# Patient Record
Sex: Female | Born: 1970 | Race: Black or African American | Hispanic: No | Marital: Single | Smoking: Never smoker
Health system: Southern US, Community
[De-identification: ages and names within clinical notes are randomized; demographics above are authoritative.]

## PROBLEM LIST (undated history)

## (undated) DIAGNOSIS — Q231 Congenital insufficiency of aortic valve: Secondary | ICD-10-CM

## (undated) DIAGNOSIS — F32A Depression, unspecified: Secondary | ICD-10-CM

## (undated) DIAGNOSIS — F329 Major depressive disorder, single episode, unspecified: Secondary | ICD-10-CM

## (undated) DIAGNOSIS — K56609 Unspecified intestinal obstruction, unspecified as to partial versus complete obstruction: Secondary | ICD-10-CM

## (undated) HISTORY — PX: UMBILICAL HERNIA REPAIR: SHX196

## (undated) HISTORY — PX: GASTROPLASTY DUODENAL SWITCH: SHX1699

## (undated) HISTORY — PX: LAPAROSCOPIC GASTRIC BANDING: SHX1100

---

## 2005-05-20 HISTORY — PX: LAPAROSCOPIC GASTRIC BANDING: SHX1100

## 2009-05-20 HISTORY — PX: GASTROPLASTY DUODENAL SWITCH: SHX1699

## 2011-05-21 HISTORY — PX: UMBILICAL HERNIA REPAIR: SHX196

## 2011-05-21 HISTORY — PX: BOWEL RESECTION: SHX1257

## 2016-06-09 ENCOUNTER — Encounter (HOSPITAL_COMMUNITY): Payer: Self-pay | Admitting: Emergency Medicine

## 2016-06-09 ENCOUNTER — Other Ambulatory Visit (HOSPITAL_COMMUNITY): Payer: Self-pay | Admitting: Radiology

## 2016-06-09 ENCOUNTER — Emergency Department (HOSPITAL_COMMUNITY): Payer: 59

## 2016-06-09 ENCOUNTER — Emergency Department (HOSPITAL_COMMUNITY)
Admission: EM | Admit: 2016-06-09 | Discharge: 2016-06-09 | Disposition: A | Discharge status: Home / Self Care | Payer: 59 | Attending: Emergency Medicine | Admitting: Emergency Medicine

## 2016-06-09 DIAGNOSIS — R930 Abnormal findings on diagnostic imaging of skull and head, not elsewhere classified: Secondary | ICD-10-CM | POA: Diagnosis not present

## 2016-06-09 DIAGNOSIS — R51 Headache: Secondary | ICD-10-CM | POA: Diagnosis not present

## 2016-06-09 DIAGNOSIS — R42 Dizziness and giddiness: Secondary | ICD-10-CM | POA: Diagnosis present

## 2016-06-09 DIAGNOSIS — R519 Headache, unspecified: Secondary | ICD-10-CM

## 2016-06-09 HISTORY — DX: Major depressive disorder, single episode, unspecified: F32.9

## 2016-06-09 HISTORY — DX: Depression, unspecified: F32.A

## 2016-06-09 HISTORY — DX: Congenital insufficiency of aortic valve: Q23.1

## 2016-06-09 HISTORY — DX: Unspecified intestinal obstruction, unspecified as to partial versus complete obstruction: K56.609

## 2016-06-09 LAB — BASIC METABOLIC PANEL
ANION GAP: 9 (ref 5–15)
BUN: <5 mg/dL — ABNORMAL LOW (ref 6–20)
CALCIUM: 8.9 mg/dL (ref 8.9–10.3)
CHLORIDE: 104 mmol/L (ref 101–111)
CO2: 24 mmol/L (ref 22–32)
Creatinine, Ser: 0.65 mg/dL (ref 0.44–1.00)
GFR calc non Af Amer: >60 mL/min (ref >60)
Glucose, Bld: 105 mg/dL — ABNORMAL HIGH (ref 65–99)
Potassium: 3.6 mmol/L (ref 3.5–5.1)
Sodium: 137 mmol/L (ref 135–145)

## 2016-06-09 LAB — MAGNESIUM: Magnesium: 1.8 mg/dL (ref 1.7–2.4)

## 2016-06-09 LAB — CBC
HCT: 36.9 % (ref 36.0–46.0)
HEMOGLOBIN: 11.7 g/dL — AB (ref 12.0–15.0)
MCH: 27.5 pg (ref 26.0–34.0)
MCHC: 31.7 g/dL (ref 30.0–36.0)
MCV: 86.8 fL (ref 78.0–100.0)
Platelets: 326 10*3/uL (ref 150–400)
RBC: 4.25 MIL/uL (ref 3.87–5.11)
RDW: 15.3 % (ref 11.5–15.5)
WBC: 7.1 10*3/uL (ref 4.0–10.5)

## 2016-06-09 LAB — I-STAT BETA HCG BLOOD, ED (MC, WL, AP ONLY): I-stat hCG, quantitative: <5 m[IU]/mL (ref <5)

## 2016-06-09 LAB — CBG MONITORING, ED: GLUCOSE-CAPILLARY: 96 mg/dL (ref 65–99)

## 2016-06-09 MED ORDER — CLONAZEPAM 0.5 MG PO TABS: 0.5000 mg | ORAL_TABLET | Freq: Two times a day (BID) | ORAL | 0 refills | Status: DC | PRN

## 2016-06-09 MED ORDER — NAPROXEN 500 MG PO TABS: 500.0000 mg | ORAL_TABLET | Freq: Two times a day (BID) | ORAL | 0 refills | Status: DC

## 2016-06-09 MED ORDER — MECLIZINE HCL 25 MG PO TABS: 25.0000 mg | ORAL_TABLET | Freq: Three times a day (TID) | ORAL | 0 refills | Status: DC | PRN

## 2016-06-09 MED ORDER — ACETAMINOPHEN 500 MG PO TABS
1000.0000 mg | ORAL_TABLET | Freq: Once | ORAL | Status: AC
  Administered 2016-06-09: 1000 mg via ORAL
  Filled 2016-06-09: qty 2

## 2016-06-09 NOTE — ED Notes (Signed)
Patient transported to MRI 

## 2016-06-09 NOTE — ED Notes (Signed)
ED Provider at bedside. 

## 2016-06-09 NOTE — ED Triage Notes (Signed)
States feels dizzy and  Weird states  Has had no n/v/d ate last night,  felt she popped a   Vessel in her head  A week ago states was straining, but not to poop, has been out ofone of her meds that she thinks she may be going thru  [place] all going on last few days , has just moved here since June no new dr

## 2016-06-09 NOTE — ED Provider Notes (Signed)
[facility]-EMERGENCY DEPT Provider Note   CSN: [id number] Arrival date & time: 06/09/16  1048     History   Chief Complaint Chief Complaint  Patient presents with  . Dizziness    HPI Shelia Perez is a 45 y.o. female.  HPI The patient was physically straining last week. She reports that she got a sudden sensation in her head that felt like a "blood vessel popped". She denies history of migraines or chronic headaches. She reports now she has had ongoing problem with a headache that comes up the back of her neck and goes to the top of her head. She has felt dizzy when she changes position or moves. It is somewhat improved by keeping her eyes closed and her head still. Dizziness has a rocking quality to it. No visual changes. Positive photophobia. Nausea but no vomiting. No gait incoordination. She feels like she is just not herself and is having trouble concentrating. No fever. Patient reports once in the past she had a lot of problems with vitamins and electrolytes. No prior history of subarachnoid or migraines. Past Medical History:  Diagnosis Date  . Bicuspid aortic valve   . Bowel obstruction   . Depression     There are no active problems to display for this patient.   Past Surgical History:  Procedure Laterality Date  . CESAREAN SECTION    . GASTROPLASTY DUODENAL SWITCH    . LAPAROSCOPIC GASTRIC BANDING    . UMBILICAL HERNIA REPAIR      OB History    No data available       Home Medications    Prior to Admission medications   Medication Sig Start Date End Date Taking? Authorizing Provider  meclizine (ANTIVERT) 25 MG tablet Take 1 tablet (25 mg total) by mouth 3 (three) times daily as needed for dizziness. 06/09/16   Arby Barrette, MD  naproxen (NAPROSYN) 500 MG tablet Take 1 tablet (500 mg total) by mouth 2 (two) times daily. 06/09/16   Arby Barrette, MD    Family History No family history on file.  Social History Social History  Substance Use Topics  .  Smoking status: Never Smoker  . Smokeless tobacco: Never Used  . Alcohol use Yes     Allergies   Codeine   Review of Systems Review of Systems 10 Systems reviewed and are negative for acute change except as noted in the HPI.   Physical Exam Updated Vital Signs BP 124/77   Pulse 101   Temp 100.1 F (37.8 C) (Oral)   Resp 16   SpO2 100%   Physical Exam  Constitutional: She is oriented to person, place, and time. She appears well-developed and well-nourished. No distress.  HENT:  Head: Normocephalic and atraumatic.  Bilateral TMs normal. Posterior or first widely patent no erythema no exudate.  Eyes: Conjunctivae and EOM are normal. Pupils are equal, round, and reactive to light.  No nystagmus.  Neck: Neck supple.  Cardiovascular: Normal rate and regular rhythm.   No murmur heard. Pulmonary/Chest: Effort normal and breath sounds normal. No respiratory distress.  Abdominal: Soft. There is no tenderness.  Musculoskeletal: Normal range of motion. She exhibits no edema or deformity.  Neurological: She is alert and oriented to person, place, and time. No cranial nerve deficit or sensory deficit. She exhibits normal muscle tone. Coordination normal.  Bilateral finger-nose examination is normal. speach is clear with normal cognitive function.  Skin: Skin is warm and dry.  Psychiatric: She has a normal mood  and affect.  Nursing note and vitals reviewed.    ED Treatments / Results  Labs (all labs ordered are listed, but only abnormal results are displayed) Labs Reviewed  BASIC METABOLIC PANEL - Abnormal; Notable for the following:       Result Value   Glucose, Bld 105 (*)    BUN <5 (*)    All other components within normal limits  CBC - Abnormal; Notable for the following:    Hemoglobin 11.7 (*)    All other components within normal limits  MAGNESIUM  URINALYSIS, ROUTINE W REFLEX MICROSCOPIC  CBG MONITORING, ED  I-STAT BETA HCG BLOOD, ED ([facility], [facility], [facility] ONLY)    EKG   EKG Interpretation None       Radiology Mr Angiogram Head Wo Contrast  Result Date: 06/09/2016 CLINICAL DATA:  Headache and vertigo. EXAM: MRI HEAD WITHOUT CONTRAST MRA HEAD WITHOUT CONTRAST TECHNIQUE: Multiplanar, multiecho pulse sequences of the brain and surrounding structures were obtained without intravenous contrast. Angiographic images of the head were obtained using MRA technique without contrast. COMPARISON:  None. FINDINGS: MRI HEAD FINDINGS Brain: There is no evidence of acute infarct, intracranial hemorrhage, mass, midline shift, or extra-axial fluid collection. The ventricles and sulci are normal. The brain is normal in signal. Limited assessment of the seventh and eighth cranial nerve complexes on this nondedicated study is unremarkable. Vascular: Major intracranial vascular flow voids are preserved. Skull and upper cervical spine: Unremarkable bone marrow signal. Sinuses/Orbits: Unremarkable orbits. Bilateral maxillary sinus mucous retention cysts. Clear mastoid air cells. Other: None. MRA HEAD FINDINGS The visualized distal vertebral arteries are widely patent and codominant. Cerebellar arteries are grossly patent. Basilar artery is widely patent. There are small posterior communicating arteries bilaterally. PCAs are patent without evidence of significant stenosis. The the visualized distal cervical internal carotid arteries are tortuous, more so on the right. The intracranial internal carotid arteries are widely patent. ACAs and MCAs are patent without evidence of major branch occlusion or significant proximal stenosis. No intracranial aneurysm is identified. IMPRESSION: 1. Unremarkable appearance of the brain. 2. Negative head MRA. Electronically Signed   By: Sebastian Ache M.D.   On: 06/09/2016 14:01   Mr Brain Wo Contrast  Result Date: 06/09/2016 CLINICAL DATA:  Headache and vertigo. EXAM: MRI HEAD WITHOUT CONTRAST MRA HEAD WITHOUT CONTRAST TECHNIQUE: Multiplanar, multiecho pulse  sequences of the brain and surrounding structures were obtained without intravenous contrast. Angiographic images of the head were obtained using MRA technique without contrast. COMPARISON:  None. FINDINGS: MRI HEAD FINDINGS Brain: There is no evidence of acute infarct, intracranial hemorrhage, mass, midline shift, or extra-axial fluid collection. The ventricles and sulci are normal. The brain is normal in signal. Limited assessment of the seventh and eighth cranial nerve complexes on this nondedicated study is unremarkable. Vascular: Major intracranial vascular flow voids are preserved. Skull and upper cervical spine: Unremarkable bone marrow signal. Sinuses/Orbits: Unremarkable orbits. Bilateral maxillary sinus mucous retention cysts. Clear mastoid air cells. Other: None. MRA HEAD FINDINGS The visualized distal vertebral arteries are widely patent and codominant. Cerebellar arteries are grossly patent. Basilar artery is widely patent. There are small posterior communicating arteries bilaterally. PCAs are patent without evidence of significant stenosis. The the visualized distal cervical internal carotid arteries are tortuous, more so on the right. The intracranial internal carotid arteries are widely patent. ACAs and MCAs are patent without evidence of major branch occlusion or significant proximal stenosis. No intracranial aneurysm is identified. IMPRESSION: 1. Unremarkable appearance of the brain. 2. Negative  head MRA. Electronically Signed   By: Sebastian AcheAllen  Grady M.D.   On: 06/09/2016 14:01    Procedures Procedures (including critical care time)  Medications Ordered in ED Medications  acetaminophen (TYLENOL) tablet 1,000 mg (1,000 mg Oral Given 06/09/16 1507)     Initial Impression / Assessment and Plan / ED Course  I have reviewed the triage vital signs and the nursing notes.  Pertinent labs & imaging results that were available during my care of the patient were reviewed by me and considered in my  medical decision making (see chart for details).     Final Clinical Impressions(s) / ED Diagnoses   Final diagnoses:  Vertigo  Acute nonintractable headache, unspecified headache type  Patient developed headache which she thought felt like a burst blood vessel. MRI/MRA does not show any evidence of aneurysm or other intracranial anomaly. She has had sporadic vertigo. No CVA identified. Patient be treated symptomatically with Antivert and naproxen. On examination she is well in appearance. She shows no signs of neurologic dysfunction or acute illness. Mental status is clear. Patient is counseled on signs and symptoms for which return and advised for follow-up evaluation.  New Prescriptions New Prescriptions   MECLIZINE (ANTIVERT) 25 MG TABLET    Take 1 tablet (25 mg total) by mouth 3 (three) times daily as needed for dizziness.   NAPROXEN (NAPROSYN) 500 MG TABLET    Take 1 tablet (500 mg total) by mouth 2 (two) times daily.     Arby BarretteMarcy Ehab Humber, MD 06/09/16 [phone]

## 2016-06-09 NOTE — ED Notes (Signed)
Pt ambulated to room with no issues, pt appeared steady on her feet

## 2018-08-03 ENCOUNTER — Other Ambulatory Visit: Payer: Self-pay | Admitting: Nurse Practitioner

## 2018-08-03 DIAGNOSIS — Z1231 Encounter for screening mammogram for malignant neoplasm of breast: Secondary | ICD-10-CM

## 2018-09-01 ENCOUNTER — Ambulatory Visit: Payer: 59

## 2018-10-16 ENCOUNTER — Ambulatory Visit: Payer: 59

## 2018-12-08 ENCOUNTER — Other Ambulatory Visit: Payer: Self-pay | Admitting: Obstetrics and Gynecology

## 2018-12-08 ENCOUNTER — Other Ambulatory Visit (HOSPITAL_COMMUNITY)
Admission: RE | Admit: 2018-12-08 | Discharge: 2018-12-08 | Disposition: A | Discharge status: Home / Self Care | Payer: Managed Care, Other (non HMO) | Source: Ambulatory Visit | Attending: Obstetrics and Gynecology | Admitting: Obstetrics and Gynecology

## 2018-12-08 DIAGNOSIS — Z01419 Encounter for gynecological examination (general) (routine) without abnormal findings: Secondary | ICD-10-CM | POA: Insufficient documentation

## 2018-12-08 DIAGNOSIS — Z1231 Encounter for screening mammogram for malignant neoplasm of breast: Secondary | ICD-10-CM

## 2018-12-31 LAB — CYTOLOGY - PAP

## 2019-01-22 ENCOUNTER — Ambulatory Visit
Admission: RE | Admit: 2019-01-22 | Discharge: 2019-01-22 | Disposition: A | Discharge status: Home / Self Care | Payer: Managed Care, Other (non HMO) | Source: Ambulatory Visit | Attending: Obstetrics and Gynecology | Admitting: Obstetrics and Gynecology

## 2019-01-22 ENCOUNTER — Other Ambulatory Visit: Payer: Self-pay

## 2019-01-22 DIAGNOSIS — Z1231 Encounter for screening mammogram for malignant neoplasm of breast: Secondary | ICD-10-CM

## 2019-07-31 ENCOUNTER — Ambulatory Visit: Payer: Managed Care, Other (non HMO) | Attending: Internal Medicine

## 2019-07-31 DIAGNOSIS — Z23 Encounter for immunization: Secondary | ICD-10-CM

## 2019-07-31 NOTE — Progress Notes (Signed)
   Covid-19 Vaccination Clinic  Name:  Shelia Perez    MRN: [id number] DOB: 08/14/1970  07/31/2019  Shelia Perez was observed post Covid-19 immunization for 15 minutes without incident. She was provided with Vaccine Information Sheet and instruction to access the V-Safe system.   Shelia Perez was instructed to call 911 with any severe reactions post vaccine: Marland Kitchen Difficulty breathing  . Swelling of face and throat  . A fast heartbeat  . A bad rash all over body  . Dizziness and weakness   Immunizations Administered    Name Date Dose VIS Date Route   Moderna COVID-19 Vaccine 07/31/2019 10:15 AM 0.5 mL 04/20/2019 Intramuscular   Manufacturer: Moderna   Lot: [id number]   NDC: 72094-709-62

## 2019-09-01 ENCOUNTER — Ambulatory Visit: Payer: Managed Care, Other (non HMO) | Attending: Internal Medicine

## 2019-09-01 DIAGNOSIS — Z23 Encounter for immunization: Secondary | ICD-10-CM

## 2019-09-01 NOTE — Progress Notes (Signed)
   Covid-19 Vaccination Clinic  Name:  Shelia Perez    MRN: [id number] DOB: 14-Apr-1971  09/01/2019  [name] was observed post Covid-19 immunization for 15 minutes without incident. She was provided with Vaccine Information Sheet and instruction to access the V-Safe system.   [name] was instructed to call 911 with any severe reactions post vaccine: Marland Kitchen Difficulty breathing  . Swelling of face and throat  . A fast heartbeat  . A bad rash all over body  . Dizziness and weakness   Immunizations Administered    Name Date Dose VIS Date Route   Moderna COVID-19 Vaccine 09/01/2019  9:44 AM 0.5 mL 04/20/2019 Intramuscular   Manufacturer: Moderna   Lot: [id number]   NDC: 14445-848-35

## 2019-10-15 IMAGING — MG MM DIGITAL SCREENING BILAT W/ TOMO W/ CAD
6 of 12 series · 6 of 36 positions shown · non-contrast
Comparison: None.

ACR Breast Density Category a: The breast tissue is almost entirely
fatty.

CLINICAL DATA: Screening.

EXAM:
DIGITAL SCREENING BILATERAL MAMMOGRAM WITH TOMO AND CAD

[L CC synth-2D (1 of 2)]
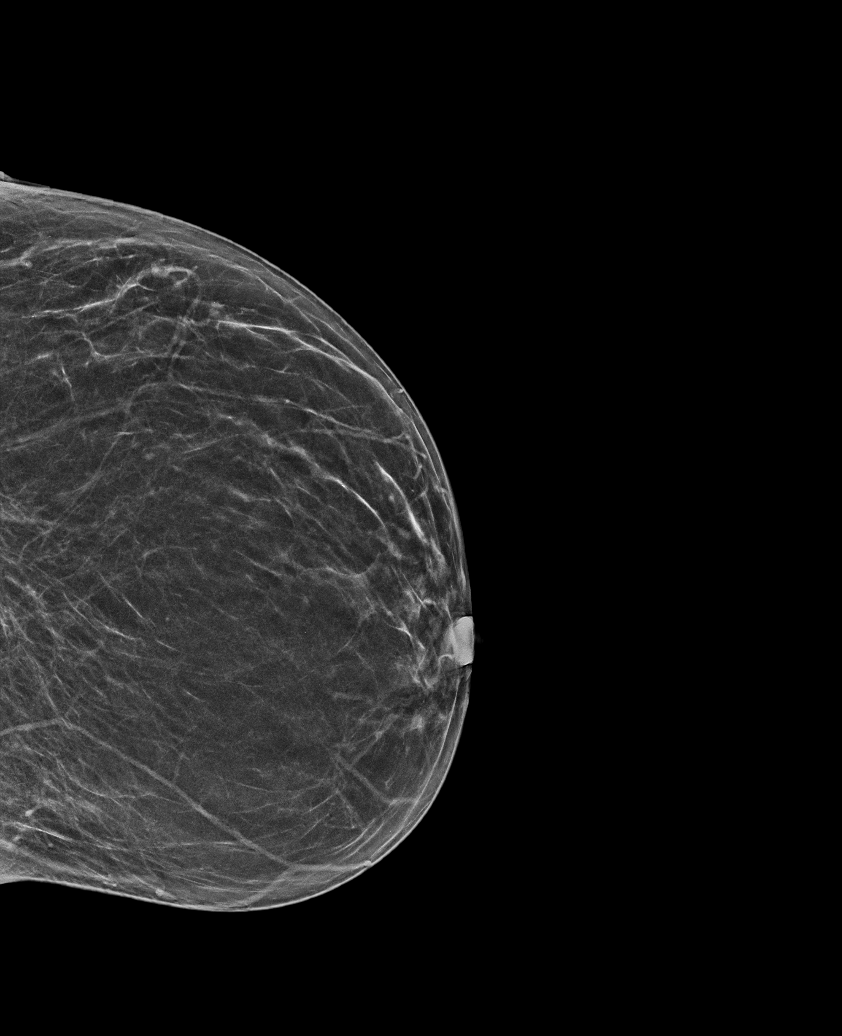

[R MLO synth-2D]
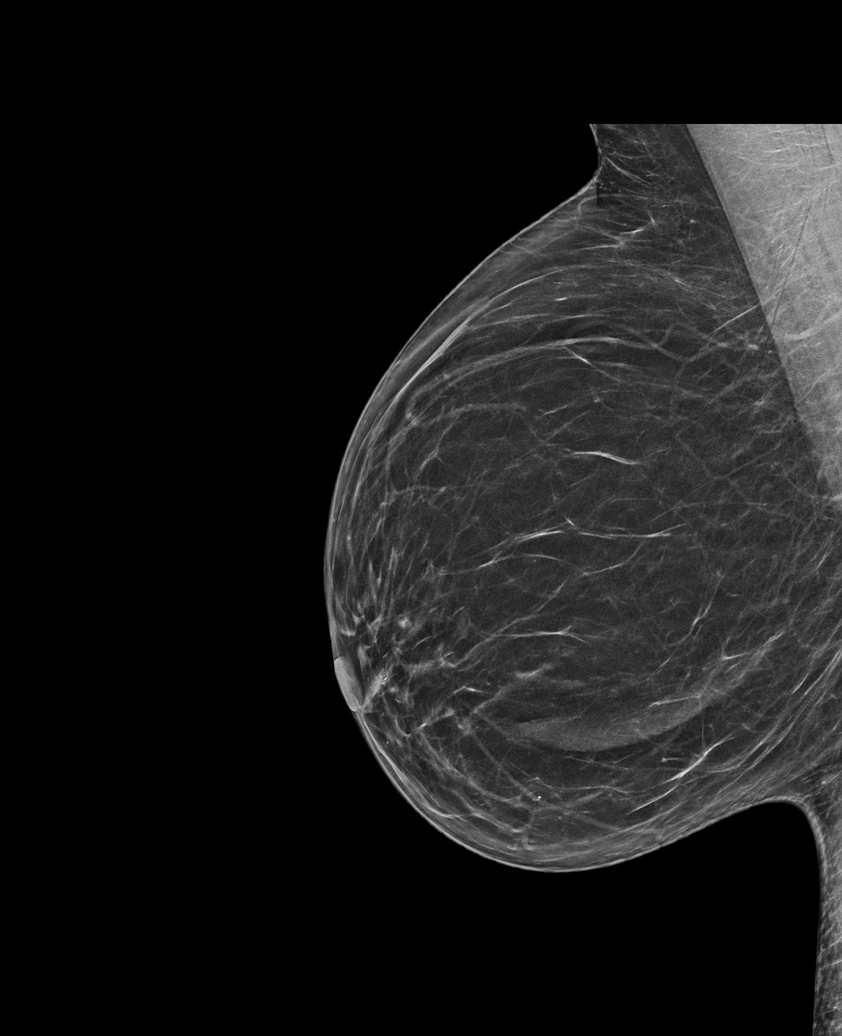

[R CC synth-2D (1 of 2)]
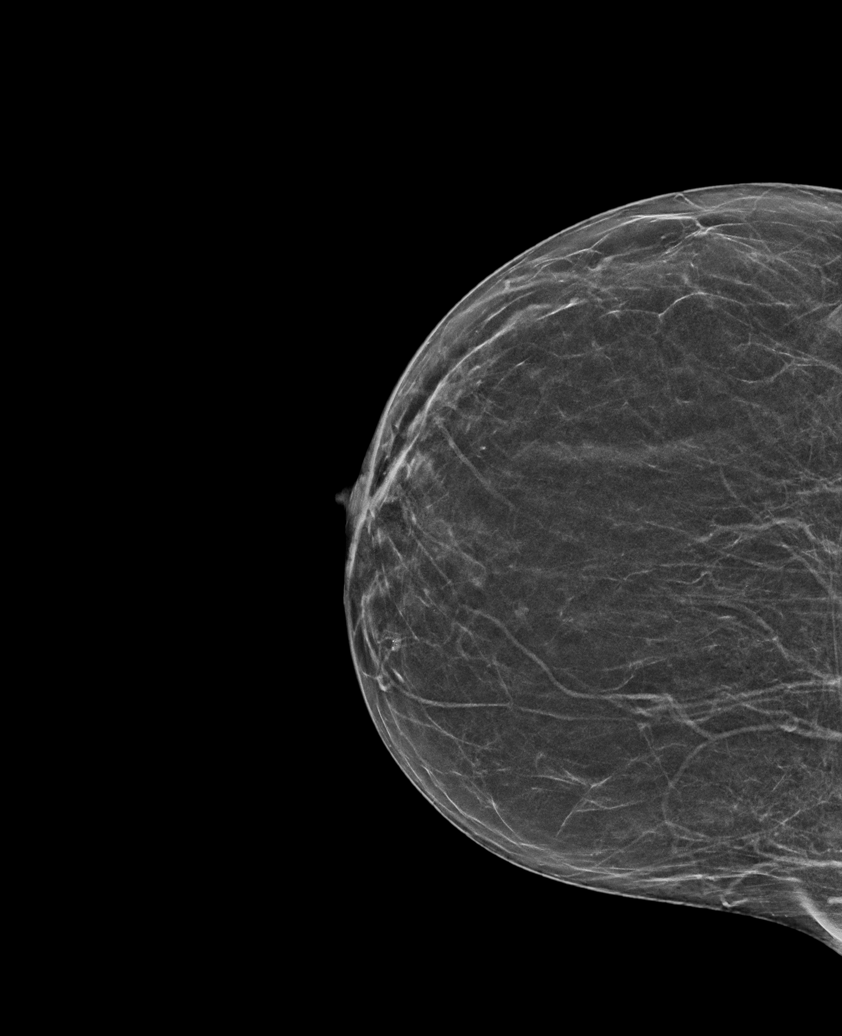

[L CC synth-2D (2 of 2)]
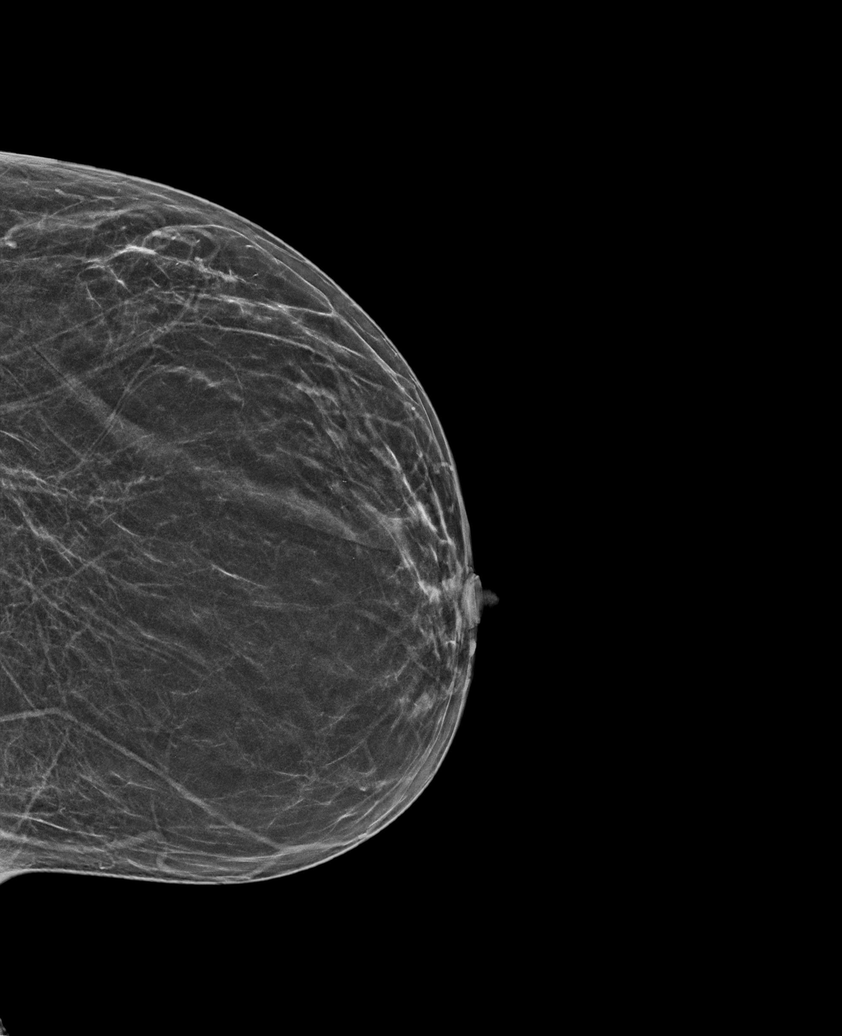

[R CC synth-2D (2 of 2)]
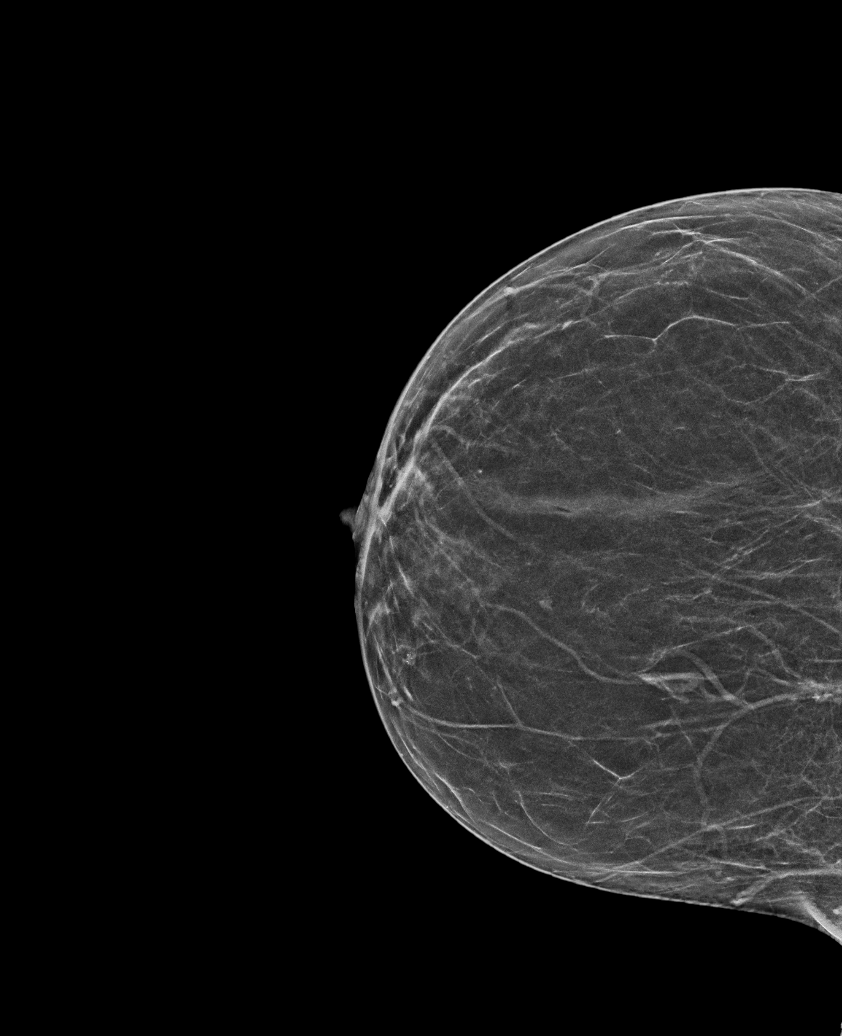

[L MLO synth-2D]
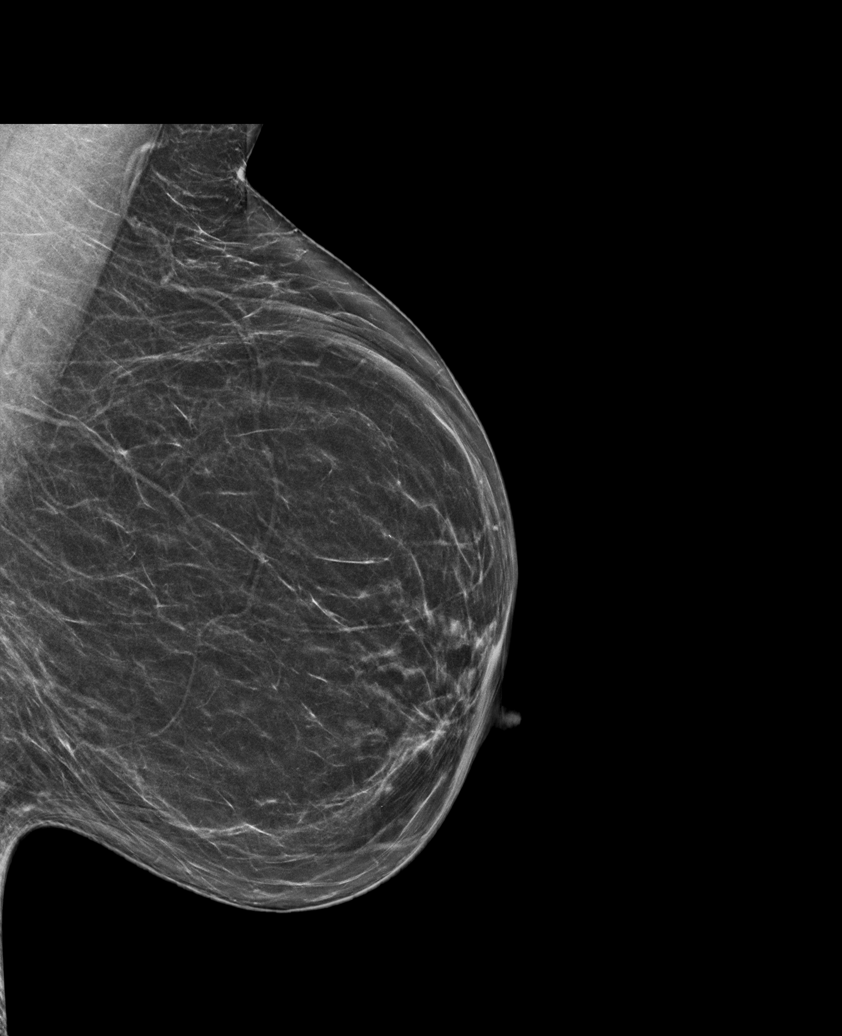

[6 of 36 positions shown; findings below may reference images not displayed]

FINDINGS: There are no findings suspicious for malignancy. Images were
processed with CAD.
IMPRESSION: No mammographic evidence of malignancy. A result letter of this
screening mammogram will be mailed directly to the patient.

RECOMMENDATION:
Screening mammogram in one year. (Code:[id number])

BI-RADS CATEGORY  1: Negative.

## 2020-03-22 ENCOUNTER — Other Ambulatory Visit: Payer: Self-pay | Admitting: Internal Medicine

## 2020-03-22 DIAGNOSIS — Z1231 Encounter for screening mammogram for malignant neoplasm of breast: Secondary | ICD-10-CM

## 2020-03-23 ENCOUNTER — Other Ambulatory Visit: Payer: Self-pay

## 2020-03-23 ENCOUNTER — Ambulatory Visit
Admission: RE | Admit: 2020-03-23 | Discharge: 2020-03-23 | Disposition: A | Discharge status: Home / Self Care | Payer: Managed Care, Other (non HMO) | Source: Ambulatory Visit | Attending: Internal Medicine | Admitting: Internal Medicine

## 2020-03-23 DIAGNOSIS — Z1231 Encounter for screening mammogram for malignant neoplasm of breast: Secondary | ICD-10-CM

## 2020-07-01 DIAGNOSIS — H524 Presbyopia: Secondary | ICD-10-CM | POA: Diagnosis not present

## 2020-08-02 DIAGNOSIS — K430 Incisional hernia with obstruction, without gangrene: Secondary | ICD-10-CM | POA: Diagnosis not present

## 2020-08-02 DIAGNOSIS — I10 Essential (primary) hypertension: Secondary | ICD-10-CM | POA: Diagnosis not present

## 2020-08-02 DIAGNOSIS — R0602 Shortness of breath: Secondary | ICD-10-CM | POA: Diagnosis not present

## 2020-11-29 DIAGNOSIS — Z20822 Contact with and (suspected) exposure to covid-19: Secondary | ICD-10-CM | POA: Diagnosis not present

## 2020-12-27 ENCOUNTER — Ambulatory Visit: Payer: BC Managed Care – PPO | Admitting: Podiatry

## 2020-12-28 ENCOUNTER — Ambulatory Visit (INDEPENDENT_AMBULATORY_CARE_PROVIDER_SITE_OTHER): Payer: BC Managed Care – PPO | Admitting: Podiatry

## 2020-12-28 ENCOUNTER — Other Ambulatory Visit: Payer: Self-pay

## 2020-12-28 DIAGNOSIS — L601 Onycholysis: Secondary | ICD-10-CM | POA: Diagnosis not present

## 2020-12-28 DIAGNOSIS — L608 Other nail disorders: Secondary | ICD-10-CM | POA: Diagnosis not present

## 2020-12-28 DIAGNOSIS — B351 Tinea unguium: Secondary | ICD-10-CM | POA: Diagnosis not present

## 2020-12-28 DIAGNOSIS — L603 Nail dystrophy: Secondary | ICD-10-CM | POA: Diagnosis not present

## 2020-12-28 NOTE — Patient Instructions (Signed)

## 2021-01-03 NOTE — Progress Notes (Addendum)
Subjective:   Patient ID: Shelia Perez, female   DOB: 50 y.o.   MRN: [id number]   HPI 50 year old female presents the office today for concerns of her left nail becoming very loose.  She is been having reoccurring nail issue and she has to see if there is fungus inside the nail which is causing issues.  Nail has been discolored.  She states that the nails, previously.  No swelling or redness.  No injury.  No other concerns.   Review of Systems  All other systems reviewed and are negative.  Past Medical History:  Diagnosis Date   Bicuspid aortic valve    Bowel obstruction (HCC)    Depression     Past Surgical History:  Procedure Laterality Date   CESAREAN SECTION     GASTROPLASTY DUODENAL SWITCH     LAPAROSCOPIC GASTRIC BANDING     UMBILICAL HERNIA REPAIR       Current Outpatient Medications:    clonazePAM (KLONOPIN) 0.5 MG tablet, Take 1 tablet (0.5 mg total) by mouth 2 (two) times daily as needed for anxiety., Disp: 30 tablet, Rfl: 0   meclizine (ANTIVERT) 25 MG tablet, Take 1 tablet (25 mg total) by mouth 3 (three) times daily as needed for dizziness., Disp: 30 tablet, Rfl: 0   naproxen (NAPROSYN) 500 MG tablet, Take 1 tablet (500 mg total) by mouth 2 (two) times daily., Disp: 30 tablet, Rfl: 0  Allergies  Allergen Reactions   Codeine     itching         Objective:  Physical Exam  General: AAO x3, NAD  Dermatological: The left hallux nail is loose and underlying nailbed only attached on the proximal medial nail fold.  There is no edema, erythema, drainage or pus or any signs of infection.  The nail is dystrophic, hypertrophic with yellow-brown discoloration.  No hyperpigmented changes.  No other open lesions.  Vascular: Dorsalis Pedis artery and Posterior Tibial artery pedal pulses are 2/4 bilateral with immedate capillary fill time. There is no pain with calf compression, swelling, warmth, erythema.   Neruologic: Grossly intact via light touch bilateral.    Musculoskeletal: Mild tenderness left hallux toenail.  No other areas of discomfort muscular strength 5/5 in all groups tested bilateral.  Gait: Unassisted, Nonantalgic.       Assessment:   Left hallux onycholysis, onychodystrophy     Plan:  -Treatment options discussed including all alternatives, risks, and complications -Etiology of symptoms were discussed -At this time, discussed total nail removal given the significant loosening of the nail.  Risks and complications were discussed with the patient for which they understand and written consent was obtained. Under sterile conditions a total of 3 mL of a mixture of 2% lidocaine plain and 0.5% Marcaine plain was infiltrated in a hallux block fashion. Once anesthetized, the skin was prepped in sterile fashion. A tourniquet was then applied. Next the left hallux nail was then easily removed making sure to remove the entire offending nail border. Once the nails were ensured to be removed area was debrided and the underlying skin was intact. There is no purulence identified in the procedure.  A dry sterile dressing was applied. After application of the dressing the tourniquet was removed and there is found to be an immediate capillary refill time to the digit. The patient tolerated the procedure well any complications. Post procedure instructions were discussed the patient for which he verbally understood. Follow-up in one week for nail check or sooner if any  problems are to arise. Discussed signs/symptoms of infection and directed to call the office immediately should any occur or go directly to the emergency room. In the meantime, encouraged to call the office with any questions, concerns, changes symptoms. -Nail sent for pathology to Isaiah Blakes DPM

## 2021-01-18 ENCOUNTER — Telehealth: Payer: Self-pay | Admitting: Podiatry

## 2021-01-18 NOTE — Telephone Encounter (Signed)
I called the patient to go over [place] results. We discussed oral (fluconazole) versus topical. She is going to think about it and we can discuss next appt. Encouraged to call back with any questions in the meantime.

## 2021-01-30 ENCOUNTER — Encounter: Payer: Self-pay | Admitting: Podiatry

## 2021-01-30 ENCOUNTER — Other Ambulatory Visit: Payer: Self-pay

## 2021-01-30 ENCOUNTER — Ambulatory Visit (INDEPENDENT_AMBULATORY_CARE_PROVIDER_SITE_OTHER): Payer: BC Managed Care – PPO | Admitting: Podiatry

## 2021-01-30 DIAGNOSIS — R232 Flushing: Secondary | ICD-10-CM | POA: Insufficient documentation

## 2021-01-30 DIAGNOSIS — B351 Tinea unguium: Secondary | ICD-10-CM

## 2021-01-30 DIAGNOSIS — D508 Other iron deficiency anemias: Secondary | ICD-10-CM | POA: Insufficient documentation

## 2021-01-30 DIAGNOSIS — Q231 Congenital insufficiency of aortic valve: Secondary | ICD-10-CM | POA: Insufficient documentation

## 2021-01-30 DIAGNOSIS — Z6834 Body mass index (BMI) 34.0-34.9, adult: Secondary | ICD-10-CM | POA: Insufficient documentation

## 2021-01-30 DIAGNOSIS — K219 Gastro-esophageal reflux disease without esophagitis: Secondary | ICD-10-CM | POA: Insufficient documentation

## 2021-01-30 DIAGNOSIS — E559 Vitamin D deficiency, unspecified: Secondary | ICD-10-CM | POA: Insufficient documentation

## 2021-01-30 DIAGNOSIS — Z780 Asymptomatic menopausal state: Secondary | ICD-10-CM | POA: Insufficient documentation

## 2021-01-30 NOTE — Patient Instructions (Signed)
I have ordered a medication for you that will come from [facility] in . They should be calling you to verify insurance and will mail the medication to you. If you live close by then you can go by their pharmacy to pick up the medication. Their phone number is [phone]. If you do not hear from them in the next few days, please give us a call at [phone].   

## 2021-02-04 NOTE — Progress Notes (Signed)
Subjective: 50 year old female presents the office today for follow-up evaluation after undergoing left hallux nail avulsion.  Also presents to discuss nail culture results.  She is the nail procedure shows been doing well.  No swelling redness or drainage.  Objective: AAO x3, NAD DP/PT pulses palpable bilaterally, CRT less than 3 seconds Status post nail avulsion of the left hallux toenail.  A small amount of the nail is starting to grow back in the base and still appears to be hypertrophic and dystrophic.  There is no edema, erythema, drainage or pus or any obvious signs of infection.  No open lesions. No pain with calf compression, swelling, warmth, erythema  Assessment: 50 year old female with onychomycosis  Plan: -All treatment options discussed with the patient including all alternatives, risks, complications.  -From a procedure standpoint she is healing well.  She could continue to keep a small amount of antibiotic ointment and a bandage until the day but leave the area open at nighttime. -Discussed nail culture results.  Discussed oral as well as topical treatments.  She does proceed with topical.  I ordered a compound cream today through [employer].  Discussed side effects and success rates of the medications. -Patient encouraged to call the office with any questions, concerns, change in symptoms.   Vivi Barrack DPM

## 2021-02-14 DIAGNOSIS — Z01419 Encounter for gynecological examination (general) (routine) without abnormal findings: Secondary | ICD-10-CM | POA: Diagnosis not present

## 2021-03-14 DIAGNOSIS — Z23 Encounter for immunization: Secondary | ICD-10-CM | POA: Diagnosis not present

## 2021-03-14 DIAGNOSIS — Z0000 Encounter for general adult medical examination without abnormal findings: Secondary | ICD-10-CM | POA: Diagnosis not present

## 2021-03-14 DIAGNOSIS — Z13220 Encounter for screening for lipoid disorders: Secondary | ICD-10-CM | POA: Diagnosis not present

## 2021-03-14 DIAGNOSIS — E559 Vitamin D deficiency, unspecified: Secondary | ICD-10-CM | POA: Diagnosis not present

## 2021-05-28 ENCOUNTER — Ambulatory Visit: Payer: BC Managed Care – PPO | Admitting: Podiatry

## 2021-05-28 ENCOUNTER — Ambulatory Visit (INDEPENDENT_AMBULATORY_CARE_PROVIDER_SITE_OTHER): Payer: BC Managed Care – PPO

## 2021-05-28 ENCOUNTER — Other Ambulatory Visit: Payer: Self-pay

## 2021-05-28 ENCOUNTER — Encounter: Payer: Self-pay | Admitting: Podiatry

## 2021-05-28 DIAGNOSIS — M7662 Achilles tendinitis, left leg: Secondary | ICD-10-CM

## 2021-05-28 MED ORDER — METHYLPREDNISOLONE 4 MG PO TBPK: ORAL_TABLET | ORAL | 0 refills | Status: DC

## 2021-05-28 NOTE — Patient Instructions (Signed)

## 2021-05-28 NOTE — Progress Notes (Signed)
°  Subjective:  Patient ID: Shelia Perez, female    DOB: [birth year],   MRN: [id number]  No chief complaint on file.   51 y.o. female presents for concern of new problem with left achilles tendon pain. This has been going on since October and denies injury. Relates the pain varies at at its worst is a 7/10. It is worse with pressure to the area and activities. Has tried bracing with little help  . Denies any other pedal complaints. Denies n/v/f/c.   Past Medical History:  Diagnosis Date   Bicuspid aortic valve    Bowel obstruction (HCC)    Depression     Objective:  Physical Exam: Vascular: DP/PT pulses 2/4 bilateral. CFT <3 seconds. Normal hair growth on digits. No edema.  Skin. No lacerations or abrasions bilateral feet.  Musculoskeletal: MMT 5/5 bilateral lower extremities in DF, PF, Inversion and Eversion. Deceased ROM in DF of ankle joint. Tender to insertion of achilles tendon. No pain proximal. No palpable defect. No pain with PF/DF. Negative Thompson test.  Neurological: Sensation intact to light touch.   Assessment:   1. Left Achilles tendinitis      Plan:  Patient was evaluated and treated and all questions answered. -Xrays reviewed. No acute fractures or dislocations. No posterior calcaneal spurring. Os trigonum present with pes planus. Mild haglund prominence noted.  -Discussed Achilles insertional tendonitis and treatment options with patient.  -Discussed stretching exercises. -Rx Medrol dose pack  provided  -Heel lifts provided and discussed proper shoewear.  -Discussed if no improvement will consider MRI/PT/EPAT/PRP injections.  -Patient to return to office 6 weeks for re-eval.    Louann Sjogren, DPM

## 2021-07-03 ENCOUNTER — Other Ambulatory Visit: Payer: Self-pay | Admitting: Internal Medicine

## 2021-07-03 DIAGNOSIS — Z1231 Encounter for screening mammogram for malignant neoplasm of breast: Secondary | ICD-10-CM

## 2021-07-04 ENCOUNTER — Ambulatory Visit
Admission: RE | Admit: 2021-07-04 | Discharge: 2021-07-04 | Disposition: A | Discharge status: Home / Self Care | Payer: BC Managed Care – PPO | Source: Ambulatory Visit | Attending: Internal Medicine | Admitting: Internal Medicine

## 2021-07-04 DIAGNOSIS — Z1231 Encounter for screening mammogram for malignant neoplasm of breast: Secondary | ICD-10-CM | POA: Diagnosis not present

## 2021-07-09 ENCOUNTER — Ambulatory Visit: Payer: BC Managed Care – PPO | Admitting: Podiatry

## 2021-07-12 ENCOUNTER — Ambulatory Visit (INDEPENDENT_AMBULATORY_CARE_PROVIDER_SITE_OTHER): Payer: BC Managed Care – PPO | Admitting: Podiatry

## 2021-07-12 ENCOUNTER — Other Ambulatory Visit: Payer: Self-pay

## 2021-07-12 DIAGNOSIS — M7662 Achilles tendinitis, left leg: Secondary | ICD-10-CM

## 2021-07-12 MED ORDER — MELOXICAM 15 MG PO TABS: 15.0000 mg | ORAL_TABLET | Freq: Every day | ORAL | 0 refills | Status: DC | PRN

## 2021-07-12 NOTE — Patient Instructions (Addendum)
Achilles Tendinitis  with Rehab Achilles tendinitis is a disorder of the Achilles tendon. The Achilles tendon connects the large calf muscles (Gastrocnemius and Soleus) to the heel bone (calcaneus). This tendon is sometimes called the heel cord. It is important for pushing-off and standing on your toes and is important for walking, running, or jumping. Tendinitis is often caused by overuse and repetitive microtrauma. SYMPTOMS Pain, tenderness, swelling, warmth, and redness may occur over the Achilles tendon even at rest. Pain with pushing off, or flexing or extending the ankle. Pain that is worsened after or during activity. CAUSES  Overuse sometimes seen with rapid increase in exercise programs or in sports requiring running and jumping. Poor physical conditioning (strength and flexibility or endurance). Running sports, especially training running down hills. Inadequate warm-up before practice or play or failure to stretch before participation. Injury to the tendon. PREVENTION  Warm up and stretch before practice or competition. Allow time for adequate rest and recovery between practices and competition. Keep up conditioning. Keep up ankle and leg flexibility. Improve or keep muscle strength and endurance. Improve cardiovascular fitness. Use proper technique. Use proper equipment (shoes, skates). To help prevent recurrence, taping, protective strapping, or an adhesive bandage may be recommended for several weeks after healing is complete. PROGNOSIS  Recovery may take weeks to several months to heal. Longer recovery is expected if symptoms have been prolonged. Recovery is usually quicker if the inflammation is due to a direct blow as compared with overuse or sudden strain. RELATED COMPLICATIONS  Healing time will be prolonged if the condition is not correctly treated. The injury must be given plenty of time to heal. Symptoms can reoccur if activity is resumed too soon. Untreated,  tendinitis may increase the risk of tendon rupture requiring additional time for recovery and possibly surgery. TREATMENT  The first treatment consists of rest anti-inflammatory medication, and ice to relieve the pain. Stretching and strengthening exercises after resolution of pain will likely help reduce the risk of recurrence. Referral to a physical therapist or athletic trainer for further evaluation and treatment may be helpful. A walking boot or cast may be recommended to rest the Achilles tendon. This can help break the cycle of inflammation and microtrauma. Arch supports (orthotics) may be prescribed or recommended by your caregiver as an adjunct to therapy and rest. Surgery to remove the inflamed tendon lining or degenerated tendon tissue is rarely necessary and has shown less than predictable results. MEDICATION  Nonsteroidal anti-inflammatory medications, such as aspirin and ibuprofen, may be used for pain and inflammation relief. Do not take within 7 days before surgery. Take these as directed by your caregiver. Contact your caregiver immediately if any bleeding, stomach upset, or signs of allergic reaction occur. Other minor pain relievers, such as acetaminophen, may also be used. Pain relievers may be prescribed as necessary by your caregiver. Do not take prescription pain medication for longer than 4 to 7 days. Use only as directed and only as much as you need. Cortisone injections are rarely indicated. Cortisone injections may weaken tendons and predispose to rupture. It is better to give the condition more time to heal than to use them. HEAT AND COLD Cold is used to relieve pain and reduce inflammation for acute and chronic Achilles tendinitis. Cold should be applied for 10 to 15 minutes every 2 to 3 hours for inflammation and pain and immediately after any activity that aggravates your symptoms. Use ice packs or an ice massage. Heat may be used before performing stretching   and  strengthening activities prescribed by your caregiver. Use a heat pack or a warm soak. SEEK MEDICAL CARE IF: Symptoms get worse or do not improve in 2 weeks despite treatment. New, unexplained symptoms develop. Drugs used in treatment may produce side effects.  EXERCISES:  RANGE OF MOTION (ROM) AND STRETCHING EXERCISES - Achilles Tendinitis  These exercises may help you when beginning to rehabilitate your injury. Your symptoms may resolve with or without further involvement from your physician, physical therapist or athletic trainer. While completing these exercises, remember:  Restoring tissue flexibility helps normal motion to return to the joints. This allows healthier, less painful movement and activity. An effective stretch should be held for at least 30 seconds. A stretch should never be painful. You should only feel a gentle lengthening or release in the stretched tissue.  STRETCH  Gastroc, Standing  Place hands on wall. Extend right / left leg, keeping the front knee somewhat bent. Slightly point your toes inward on your back foot. Keeping your right / left heel on the floor and your knee straight, shift your weight toward the wall, not allowing your back to arch. You should feel a gentle stretch in the right / left calf. Hold this position for 10 seconds. Repeat 3 times. Complete this stretch 2 times per day.  STRETCH  Soleus, Standing  Place hands on wall. Extend right / left leg, keeping the other knee somewhat bent. Slightly point your toes inward on your back foot. Keep your right / left heel on the floor, bend your back knee, and slightly shift your weight over the back leg so that you feel a gentle stretch deep in your back calf. Hold this position for 10 seconds. Repeat 3 times. Complete this stretch 2 times per day.  STRETCH  Gastrocsoleus, Standing  Note: This exercise can place a lot of stress on your foot and ankle. Please complete this exercise only if specifically  instructed by your caregiver.  Place the ball of your right / left foot on a step, keeping your other foot firmly on the same step. Hold on to the wall or a rail for balance. Slowly lift your other foot, allowing your body weight to press your heel down over the edge of the step. You should feel a stretch in your right / left calf. Hold this position for 10 seconds. Repeat this exercise with a slight bend in your knee. Repeat 3 times. Complete this stretch 2 times per day.   STRENGTHENING EXERCISES - Achilles Tendinitis These exercises may help you when beginning to rehabilitate your injury. They may resolve your symptoms with or without further involvement from your physician, physical therapist or athletic trainer. While completing these exercises, remember:  Muscles can gain both the endurance and the strength needed for everyday activities through controlled exercises. Complete these exercises as instructed by your physician, physical therapist or athletic trainer. Progress the resistance and repetitions only as guided. You may experience muscle soreness or fatigue, but the pain or discomfort you are trying to eliminate should never worsen during these exercises. If this pain does worsen, stop and make certain you are following the directions exactly. If the pain is still present after adjustments, discontinue the exercise until you can discuss the trouble with your clinician.  STRENGTH - Plantar-flexors  Sit with your right / left leg extended. Holding onto both ends of a rubber exercise band/tubing, loop it around the ball of your foot. Keep a slight tension in the band. Slowly   push your toes away from you, pointing them downward. Hold this position for 10 seconds. Return slowly, controlling the tension in the band/tubing. Repeat 3 times. Complete this exercise 2 times per day.   STRENGTH - Plantar-flexors  Stand with your feet shoulder width apart. Steady yourself with a wall or table  using as little support as needed. Keeping your weight evenly spread over the width of your feet, rise up on your toes.* Hold this position for 10 seconds. Repeat 3 times. Complete this exercise 2 times per day.  *If this is too easy, shift your weight toward your right / left leg until you feel challenged. Ultimately, you may be asked to do this exercise with your right / left foot only.  STRENGTH  Plantar-flexors, Eccentric  Note: This exercise can place a lot of stress on your foot and ankle. Please complete this exercise only if specifically instructed by your caregiver.  Place the balls of your feet on a step. With your hands, use only enough support from a wall or rail to keep your balance. Keep your knees straight and rise up on your toes. Slowly shift your weight entirely to your right / left toes and pick up your opposite foot. Gently and with controlled movement, lower your weight through your right / left foot so that your heel drops below the level of the step. You will feel a slight stretch in the back of your calf at the end position. Use the healthy leg to help rise up onto the balls of both feet, then lower weight only on the right / left leg again. Build up to 15 repetitions. Then progress to 3 consecutive sets of 15 repetitions.* After completing the above exercise, complete the same exercise with a slight knee bend (about 30 degrees). Again, build up to 15 repetitions. Then progress to 3 consecutive sets of 15 repetitions.* Perform this exercise 2 times per day.  *When you easily complete 3 sets of 15, your physician, physical therapist or athletic trainer may advise you to add resistance by wearing a backpack filled with additional weight.  STRENGTH - Plantar Flexors, Seated  Sit on a chair that allows your feet to rest flat on the ground. If necessary, sit at the edge of the chair. Keeping your toes firmly on the ground, lift your right / left heel as far as you can without  increasing any discomfort in your ankle. Repeat 3 times. Complete this exercise 2 times a day.  Meloxicam Tablets What is this medication? MELOXICAM (mel OX i cam) treats mild to moderate pain, inflammation, or arthritis. It works by decreasing inflammation. It belongs to a group of medications called NSAIDs. This medicine may be used for other purposes; ask your health care provider or pharmacist if you have questions. COMMON BRAND NAME(S): Mobic What should I tell my care team before I take this medication? They need to know if you have any of these conditions: Asthma (lung or breathing disease) Bleeding disorder Coronary artery bypass graft (CABG) within the past 2 weeks Dehydration Heart attack Heart disease Heart failure High blood pressure If you often drink alcohol Kidney disease Liver disease Smoke tobacco cigarettes Stomach bleeding Stomach ulcers, other stomach or intestine problems Take medications that treat or prevent blood clots Taking other steroids like dexamethasone or prednisone An unusual or allergic reaction to meloxicam, other medications, foods, dyes, or preservatives Pregnant or trying to get pregnant Breast-feeding How should I use this medication? Take this medication by mouth.   Take it as directed on the prescription label at the same time every day. You can take it with or without food. If it upsets your stomach, take it with food. Do not use it more often than directed. There may be unused or extra doses in the bottle after you finish your treatment. Talk to your care team if you have questions about your dose. A special MedGuide will be given to you by the pharmacist with each prescription and refill. Be sure to read this information carefully each time. Talk to your care team about the use of this medication in children. Special care may be needed. Patients over 65 years of age may have a stronger reaction and need a smaller dose. Overdosage: If you  think you have taken too much of this medicine contact a poison control center or emergency room at once. NOTE: This medicine is only for you. Do not share this medicine with others. What if I miss a dose? If you miss a dose, take it as soon as you can. If it is almost time for your next dose, take only that dose. Do not take double or extra doses. What may interact with this medication? Do not take this medication with any of the following: Cidofovir Ketorolac This medication may also interact with the following: Aspirin and aspirin-like medications Certain medications for blood pressure, heart disease, irregular heart beat Certain medications for depression, anxiety, or psychotic disturbances Certain medications that treat or prevent blood clots like warfarin, enoxaparin, dalteparin, apixaban, dabigatran, rivaroxaban Cyclosporine Diuretics Fluconazole Lithium Methotrexate Other NSAIDs, medications for pain and inflammation, like ibuprofen and naproxen Pemetrexed This list may not describe all possible interactions. Give your health care provider a list of all the medicines, herbs, non-prescription drugs, or dietary supplements you use. Also tell them if you smoke, drink alcohol, or use illegal drugs. Some items may interact with your medicine. What should I watch for while using this medication? Visit your care team for regular checks on your progress. Tell your care team if your symptoms do not start to get better or if they get worse. Do not take other medications that contain aspirin, ibuprofen, or naproxen with this medication. Side effects such as stomach upset, nausea, or ulcers may be more likely to occur. Many non-prescription medications contain aspirin, ibuprofen, or naproxen. Always read labels carefully. This medication can cause serious ulcers and bleeding in the stomach. It can happen with no warning. Smoking, drinking alcohol, older age, and poor health can also increase  risks. Call your care team right away if you have stomach pain or blood in your vomit or stool. This medication does not prevent a heart attack or stroke. This medication may increase the chance of a heart attack or stroke. The chance may increase the longer you use this medication or if you have heart disease. If you take aspirin to prevent a heart attack or stroke, talk to your care team about using this medication. Alcohol may interfere with the effect of this medication. Avoid alcoholic drinks. This medication may cause serious skin reactions. They can happen weeks to months after starting the medication. Contact your care team right away if you notice fevers or flu-like symptoms with a rash. The rash may be red or purple and then turn into blisters or peeling of the skin. Or, you might notice a red rash with swelling of the face, lips or lymph nodes in your neck or under your arms. Talk to your care team   if you are pregnant before taking this medication. Taking this medication between weeks 20 and 30 of pregnancy may harm your unborn baby. Your care team will monitor you closely if you need to take it. After 30 weeks of pregnancy, do not take this medication. You may get drowsy or dizzy. Do not drive, use machinery, or do anything that needs mental alertness until you know how this medication affects you. Do not stand up or sit up quickly, especially if you are an older patient. This reduces the risk of dizzy or fainting spells. Be careful brushing or flossing your teeth or using a toothpick because you may get an infection or bleed more easily. If you have any dental work done, tell your dentist you are receiving this medication. This medication may make it more difficult to get pregnant. Talk to your care team if you are concerned about your fertility. What side effects may I notice from receiving this medication? Side effects that you should report to your care team as soon as possible: Allergic  reactions--skin rash, itching, hives, swelling of the face, lips, tongue, or throat Bleeding--bloody or black, tar-like stools, vomiting blood or brown material that looks like coffee grounds, red or dark brown urine, small red or purple spots on skin, unusual bruising or bleeding Heart attack--pain or tightness in the chest, shoulders, arms, or jaw, nausea, shortness of breath, cold or clammy skin, feeling faint or lightheaded Heart failure--shortness of breath, swelling of ankles, feet, or hands, sudden weight gain, unusual weakness or fatigue Increase in blood pressure Kidney injury--decrease in the amount of urine, swelling of the ankles, hands, or feet Liver injury--right upper belly pain, loss of appetite, nausea, light-colored stool, dark yellow or brown urine, yellowing skin or eyes, unusual weakness or fatigue Rash, fever, and swollen lymph nodes Redness, blistering, peeling, or loosening of the skin, including inside the mouth Stroke--sudden numbness or weakness of the face, arm, or leg, trouble speaking, confusion, trouble walking, loss of balance or coordination, dizziness, severe headache, change in vision Side effects that usually do not require medical attention (report to your care team if they continue or are bothersome): Diarrhea Nausea Upset stomach This list may not describe all possible side effects. Call your doctor for medical advice about side effects. You may report side effects to FDA at 1-800-FDA-1088. Where should I keep my medication? Keep out of the reach of children and pets. Store at room temperature between 20 and 25 degrees C (68 and 77 degrees F). Protect from moisture. Keep the container tightly closed. Get rid of any unused medication after the expiration date. To get rid of medications that are no longer needed or have expired: Take the medication to a medication take-back program. Check with your pharmacy or law enforcement to find a location. If you cannot  return the medication, check the label or package insert to see if the medication should be thrown out in the garbage or flushed down the toilet. If you are not sure, ask your care team. If it is safe to put it in the trash, empty the medication out of the container. Mix the medication with cat litter, dirt, coffee grounds, or other unwanted substance. Seal the mixture in a bag or container. Put it in the trash. NOTE: This sheet is a summary. It may not cover all possible information. If you have questions about this medicine, talk to your doctor, pharmacist, or health care provider.  2022 [employer] (2020-08-02 00:00:00)  

## 2021-07-16 DIAGNOSIS — Z0183 Encounter for blood typing: Secondary | ICD-10-CM | POA: Diagnosis not present

## 2021-07-17 NOTE — Progress Notes (Signed)
Subjective: 51 year old female presents the office today for Evaluation of the plantar Achilles tendinitis on the left side.  She was last seen by Dr. Ralene Cork for this on May 28, 2021.  At that time Medrol Dosepak was prescribed as well as stretching as well as heel lifts and shoe.  Complications.  She states that she is doing about the same and she has discomfort about her heel.  The pain is intermittent discussed throbbing sensation.  No recent injuries.  Objective: AAO x3, NAD DP/PT pulses palpable bilaterally, CRT less than 3 seconds There is tenderness to palpation noted along the posterior aspect the left heel and the insertion of the Achilles tendon.  Mild discomfort on the inferior portion Achilles tendon.  No pain with lateral compression of calcaneus.  Trace edema.  No erythema.  No deficit noted within the Achilles tendon.  No pain with calf compression, swelling, warmth, erythema  Assessment: Achilles tendinitis  Plan: -All treatment options discussed with the patient including all alternatives, risks, complications.  -At this point given her ongoing symptoms recommend physical therapy and referral was placed for this today.  Continue with stretching, icing daily as well as supportive shoe gear as well as anti-inflammatories.  If no improvement then MRI. -Patient encouraged to call the office with any questions, concerns, change in symptoms.   Vivi Barrack DPM

## 2021-07-25 ENCOUNTER — Ambulatory Visit: Payer: BC Managed Care – PPO | Admitting: Podiatry

## 2021-08-07 ENCOUNTER — Encounter (HOSPITAL_COMMUNITY): Payer: Self-pay | Admitting: Physical Therapy

## 2021-08-07 ENCOUNTER — Ambulatory Visit (HOSPITAL_COMMUNITY): Payer: BC Managed Care – PPO | Attending: Podiatry | Admitting: Physical Therapy

## 2021-08-07 ENCOUNTER — Other Ambulatory Visit: Payer: Self-pay

## 2021-08-07 DIAGNOSIS — R262 Difficulty in walking, not elsewhere classified: Secondary | ICD-10-CM | POA: Diagnosis not present

## 2021-08-07 DIAGNOSIS — M7662 Achilles tendinitis, left leg: Secondary | ICD-10-CM | POA: Diagnosis not present

## 2021-08-07 DIAGNOSIS — M25572 Pain in left ankle and joints of left foot: Secondary | ICD-10-CM | POA: Diagnosis not present

## 2021-08-07 NOTE — Therapy (Signed)
?OUTPATIENT PHYSICAL THERAPY LOWER EXTREMITY EVALUATION ? ? ?Patient Name: Shelia Perez ?MRN: [id number] ?DOB:1970-10-22, 51 y.o., female ?Today's Date: 08/07/2021 ? ? PT End of Session - 08/07/21 7096   ? ? Visit Number 1   ? Number of Visits 12   ? Date for PT Re-Evaluation 09/18/21   ? Authorization Type BCBS   ? Progress Note Due on Visit 10   ? PT Start Time 0845   pt is late  ? PT Stop Time 0915   ? PT Time Calculation (min) 30 min   ? Activity Tolerance Patient tolerated treatment well   ? ?  ?  ? ?  ? ? ?Past Medical History:  ?Diagnosis Date  ? Bicuspid aortic valve   ? Bowel obstruction (HCC)   ? Depression   ? ?Past Surgical History:  ?Procedure Laterality Date  ? CESAREAN SECTION    ? GASTROPLASTY DUODENAL SWITCH    ? LAPAROSCOPIC GASTRIC BANDING    ? UMBILICAL HERNIA REPAIR    ? ?Patient Active Problem List  ? Diagnosis Date Noted  ? Bicuspid aortic valve 01/30/2021  ? Body mass index (BMI) 34.0-34.9, adult 01/30/2021  ? Gastroesophageal reflux disease 01/30/2021  ? Hot flashes 01/30/2021  ? Iron deficiency anemia secondary to inadequate dietary iron intake 01/30/2021  ? Menopause 01/30/2021  ? Morbid obesity (HCC) 01/30/2021  ? Vitamin D deficiency 01/30/2021  ? ? ?PCP: Lorenda Ishihara, MD ? ?REFERRING PROVIDER: Vivi Barrack, DPM ? ?REFERRING DIAG: Left plantar fascitis  ? ?THERAPY DIAG:  ?Difficulty walking, Left ankle pain ONSET DATE: 02/01/2021 ? ?SUBJECTIVE:  ? ?SUBJECTIVE STATEMENT: ?Shelia Perez presents the office today for Evaluation of the plantar Achilles tendinitis on the left side. She states she has iced, had a Medrol Dosepak has stretched  as well as used heel lifts without relief. Her pain is intermittent.  She works out at least five days a week  ? ?PERTINENT HISTORY: ?Unremarkable  ? ?PAIN:  ?Are you having pain? No  Can go up as high as a 7/10 ? ?PRECAUTIONS: None ? ?WEIGHT BEARING RESTRICTIONS No ? ?FALLS:  ?Has patient fallen in last 6 months? No,  ? ?LIVING  ENVIRONMENT: ?Lives with: lives with their family ?Lives in: House/apartment ?Stairs: Yes; 19 ?Has following equipment at home: None ? ?OCCUPATION: desk job  ? ?PLOF: Independent ? ?PATIENT GOALS : no pain ? ? ?OBJECTIVE:  ? ?DIAGNOSTIC FINDINGS: na  ? ?PATIENT SURVEYS:  ?FOTO 29 ? ?COGNITION: ? Overall cognitive status: Within functional limits for tasks assessed   ?Balance:  able to hold for 60" on each Le  ? ?PALPATION: ?Significant thickening of the gastroc tendon with mm spasm in gastroc mm belly  ? ?LE ROM:  WFL ? ? ?LE MMT:  WFL ? ? ? ?TODAY'S TREATMENT:   ?  ?Reviewed HEP; requested pt to go up to 30" hold on HEP decrease to 3 reps  ? ? ?PATIENT EDUCATION:  ?Education details: avoid running and jumping at this time, water aerobics and walking are fine ?Person educated: Patient ?Education method: Explanation ?Education comprehension: verbalized understanding ? ? ?HOME EXERCISE PROGRAM: ?PT has HEP from MD for plantar fascia and gastroc stretching ? ?ASSESSMENT: ? ?CLINICAL IMPRESSION: ?Patient is a 51 y.o. female  who was seen today for physical therapy evaluation and treatment for Left achilles tendonitis. She has had a steroid  ?Pak, has iced, stretched and used inserts without results.   ? ? ?OBJECTIVE IMPAIRMENTS decreased activity tolerance, difficulty walking, increased  fascial restrictions, increased muscle spasms, and pain.  ? ?ACTIVITY LIMITATIONS cleaning, community activity, yard work, shopping, and working out .  ? ? ?REHAB POTENTIAL: Good ? ?CLINICAL DECISION MAKING: Stable/uncomplicated ? ?EVALUATION COMPLEXITY: Low ? ? ?GOALS: ?Goals reviewed with patient? No will review last treatment  ? ?SHORT TERM GOALS: Target date: 08/28/2021 ? ?Pt to be I in HEP to decrease pain to no greater than a 4/10  ?Baseline: 7 ?Goal status: INITIAL ? ? ? ?LONG TERM GOALS: Target date: 09/18/2021 ? ?Pt pain to be no greater than a 2/10 in her left achilles  ?Baseline: 7 ?Goal status: INITIAL ? ?2.  Pt to be able to  return to jogging for 2 miles without pain increased beyond 2/10  in her Left achilles  ?Baseline: 7 ?Goal status: INITIAL ?3.  Pt to be able to go down steps without feeling a pull in her achilles tendon.  ?  Baseline:  pt feels a pull ?  Goal status: INITIAL ? ?PLAN: ?PT FREQUENCY: 2x/week ? ?PT DURATION: 6 weeks ? ?PLANNED INTERVENTIONS: Therapeutic exercises, Therapeutic activity, Gait training, Patient/Family education, Dry Needling, Moist heat, and Manual therapyk iontophoresis with dexamethasone.  ? ?PLAN FOR NEXT SESSION: focus on manual and advance balance for stability , stretches  ? ? ?Virgina Organ, PT CLT ?[phone]  ?08/07/2021, 9:01 AM  ?

## 2021-08-10 ENCOUNTER — Other Ambulatory Visit: Payer: Self-pay | Admitting: Podiatry

## 2021-08-10 NOTE — Telephone Encounter (Signed)
Please advise  duplicate medication w/naproxin

## 2021-08-17 ENCOUNTER — Ambulatory Visit (HOSPITAL_COMMUNITY): Payer: BC Managed Care – PPO | Admitting: Physical Therapy

## 2021-08-21 ENCOUNTER — Ambulatory Visit (HOSPITAL_COMMUNITY): Payer: BC Managed Care – PPO | Attending: Podiatry | Admitting: Physical Therapy

## 2021-08-21 DIAGNOSIS — M25572 Pain in left ankle and joints of left foot: Secondary | ICD-10-CM | POA: Insufficient documentation

## 2021-08-21 DIAGNOSIS — R262 Difficulty in walking, not elsewhere classified: Secondary | ICD-10-CM | POA: Insufficient documentation

## 2021-08-21 DIAGNOSIS — M7662 Achilles tendinitis, left leg: Secondary | ICD-10-CM | POA: Diagnosis not present

## 2021-08-21 NOTE — Therapy (Signed)
?OUTPATIENT PHYSICAL THERAPY TREATMENT NOTE ? ? ?Patient Name: Shelia Perez ?MRN: [id number] ?DOB:Sep 04, 1970, 51 y.o., female ?Today's Date: 08/21/2021 ? ?PCP: Lorenda Ishihara, MD ?REFERRING PROVIDER: Vivi Barrack, DPM ? ? PT End of Session - 08/21/21 6759   ? ? Visit Number 2   ? Number of Visits 12   ? Date for PT Re-Evaluation 09/18/21   ? Authorization Type BCBS   ? Progress Note Due on Visit 10   ? PT Start Time [phone]   ? PT Stop Time 0910   ? PT Time Calculation (min) 38 min   ? Activity Tolerance Patient tolerated treatment well   ? ?  ?  ? ?  ? ? ?Past Medical History:  ?Diagnosis Date  ? Bicuspid aortic valve   ? Bowel obstruction (HCC)   ? Depression   ? ?Past Surgical History:  ?Procedure Laterality Date  ? CESAREAN SECTION    ? GASTROPLASTY DUODENAL SWITCH    ? LAPAROSCOPIC GASTRIC BANDING    ? UMBILICAL HERNIA REPAIR    ? ?Patient Active Problem List  ? Diagnosis Date Noted  ? Bicuspid aortic valve 01/30/2021  ? Body mass index (BMI) 34.0-34.9, adult 01/30/2021  ? Gastroesophageal reflux disease 01/30/2021  ? Hot flashes 01/30/2021  ? Iron deficiency anemia secondary to inadequate dietary iron intake 01/30/2021  ? Menopause 01/30/2021  ? Morbid obesity (HCC) 01/30/2021  ? Vitamin D deficiency 01/30/2021  ? ? ?REFERRING DIAG: Left Achilles tendonitis ? ?THERAPY DIAG:  ?Left Achilles tendinitis ? ?Difficulty in walking, not elsewhere classified ? ?Pain in left ankle and joints of left foot ? ?PERTINENT HISTORY: unremarkable ? ?PRECAUTIONS: none ? ?SUBJECTIVE: Pt states the pain comes and goes and is usually the best in the morning.  Reports when she has pain it is localized in her achilles (pointing to posterior ankle).  Pt states it gets worse as the day goes on and increased activity bothers it.  Reports compliance with HEP. ? ?PAIN:  ?Are you having pain? No ? ?OBJECTIVE:   ? ?TODAY'S TREATMENT:   ?08/21/21: ?Heelraises on incline 10X slowly ?Toeraises on incline 10X slowly ?Slant board  stretch for gastroc 3X30" ?Slant board stretch for soleus 3X30" ?Hamstring strech using 12" step 3X30" Lt only ? ?Manual : STM for Lt gastroc/achilles in prone ? ?08/07/21: ?At Evaluation:  Reviewed HEP; requested pt to go up to 30" hold on HEP decrease to 3 reps  ?  ?  ?PATIENT EDUCATION:  ?Education details: Review of goals, POC moving forward.  At eval was educated to avoid running and jumping at this time, water aerobics and walking are fine ?Person educated: Patient ?Education method: Explanation ?Education comprehension: verbalized understanding ?  ?  ?HOME EXERCISE PROGRAM: ?PT has HEP from MD for plantar fascia and gastroc stretching ?  ?ASSESSMENT: ?  ?CLINICAL IMPRESSION: ?Reviewed goals and POC moving forward.  Initiated stretches to improve flexibility.  Soleus and hamstring stretch added in addition to gastroc.  Pt with positive results obtained and tightness noted.  Completed soft tissue mobilization in prone to Lt gastroc and achilles with tightness palpated throughout.  Pt did not have any complaints of pain during or at conclusion of session.  Pt will continue to benefit from skilled physical therapy.  ?  ?GOALS: Goals reviewed with patient?Yes  ?  ?SHORT TERM GOALS: Target date: 08/28/2021 ?  ?1.Pt to be I in HEP to decrease pain to no greater than a 4/10  ?Baseline: 7 ?Goal status: ONGOING  ?  ?  ?  LONG TERM GOALS: Target date: 09/18/2021 ?  ?Pt pain to be no greater than a 2/10 in her left achilles  ?Baseline: 7 ?Goal status: ONGOING  ?  ?2.  Pt to be able to return to jogging for 2 miles without pain increased beyond 2/10  in her Left achilles  ?Baseline: 7 ?Goal status: ONGOING  ? ?3.  Pt to be able to go down steps without feeling a pull in her achilles tendon.  ?Baseline:  pt feels a pull ?Goal status: ONGOING  ? ? ?PLAN:  PT FREQUENCY: 2x/week ?  ?PT DURATION: 6 weeks ?  ?PLANNED INTERVENTIONS: Therapeutic exercises, Therapeutic activity, Gait training, Patient/Family education, Dry Needling,  Moist heat, and Manual therapyk iontophoresis with dexamethasone.  ?  ?PLAN FOR NEXT SESSION: focus on manual and advance balance for stability , stretches  ?  ? ? ? ?Wilber Fini Kae Heller, PTA/CLT, WTA ?[phone] ?Emeline Gins B, PTA ?08/21/2021, 8:53 AM ? ?   ?

## 2021-08-23 ENCOUNTER — Ambulatory Visit: Payer: BC Managed Care – PPO | Admitting: Podiatry

## 2021-08-24 ENCOUNTER — Encounter (HOSPITAL_COMMUNITY): Payer: Self-pay | Admitting: Physical Therapy

## 2021-08-24 ENCOUNTER — Ambulatory Visit (HOSPITAL_COMMUNITY): Payer: BC Managed Care – PPO | Admitting: Physical Therapy

## 2021-08-24 DIAGNOSIS — M25572 Pain in left ankle and joints of left foot: Secondary | ICD-10-CM

## 2021-08-24 DIAGNOSIS — R262 Difficulty in walking, not elsewhere classified: Secondary | ICD-10-CM | POA: Diagnosis not present

## 2021-08-24 DIAGNOSIS — M7662 Achilles tendinitis, left leg: Secondary | ICD-10-CM | POA: Diagnosis not present

## 2021-08-24 NOTE — Therapy (Signed)
?OUTPATIENT PHYSICAL THERAPY TREATMENT NOTE ? ? ?Patient Name: Shelia Perez ?MRN: [id number] ?DOB:1971/05/04, 51 y.o., female ?Today's Date: 08/24/2021 ? ?PCP: Lorenda Ishihara, MD ?REFERRING PROVIDER: Lorenda Ishihara,* ? ?END OF SESSION:  ? PT End of Session - 08/24/21 9983   ? ? Visit Number 3   ? Number of Visits 12   ? Date for PT Re-Evaluation 09/18/21   ? Authorization Type BCBS   ? Progress Note Due on Visit 10   ? PT Start Time [phone]   ? PT Stop Time 0809   ? PT Time Calculation (min) 32 min   ? Activity Tolerance Patient tolerated treatment well   ? Behavior During Therapy [facility] for tasks assessed/performed   ? ?  ?  ? ?  ? ? ?Past Medical History:  ?Diagnosis Date  ? Bicuspid aortic valve   ? Bowel obstruction (HCC)   ? Depression   ? ?Past Surgical History:  ?Procedure Laterality Date  ? CESAREAN SECTION    ? GASTROPLASTY DUODENAL SWITCH    ? LAPAROSCOPIC GASTRIC BANDING    ? UMBILICAL HERNIA REPAIR    ? ?Patient Active Problem List  ? Diagnosis Date Noted  ? Bicuspid aortic valve 01/30/2021  ? Body mass index (BMI) 34.0-34.9, adult 01/30/2021  ? Gastroesophageal reflux disease 01/30/2021  ? Hot flashes 01/30/2021  ? Iron deficiency anemia secondary to inadequate dietary iron intake 01/30/2021  ? Menopause 01/30/2021  ? Morbid obesity (HCC) 01/30/2021  ? Vitamin D deficiency 01/30/2021  ? ? ?REFERRING DIAG: Left Achilles tendonitis ? ?THERAPY DIAG:  ?Left Achilles tendinitis ? ?Difficulty in walking, not elsewhere classified ? ?Pain in left ankle and joints of left foot ? ?PERTINENT HISTORY: unremarkable ? ?PRECAUTIONS: none ? ?SUBJECTIVE: Patient reports that she has been able to keep up with her exercises from the MD and she had her first workout this morning after having been sick. ? ?PAIN:  ?Are you having pain? No ? ? ?OBJECTIVE:   ?  ?TODAY'S TREATMENT:   ?08/24/21: ?Aerobic: ?-Recumbent bike x55min ?-Single leg calf raise isometric on leg press #8 4x30s holds (with rests) ?-Patient  education ? ?08/21/21: ?Heelraises on incline 10X slowly ?Toeraises on incline 10X slowly ?Slant board stretch for gastroc 3X30" ?Slant board stretch for soleus 3X30" ?Hamstring strech using 12" step 3X30" Lt only ?  ?Manual : STM for Lt gastroc/achilles in prone ?  ?08/07/21: ?At Evaluation:  Reviewed HEP; requested pt to go up to 30" hold on HEP decrease to 3 reps  ?  ?  ?PATIENT EDUCATION:  ?Education details: Tendon overuse injuries, optimal treatment, tendon loading, diet for tendon injuries. ?Person educated: Patient ?Education method: Explanation ?Education comprehension: verbalized understanding ?  ?  ?HOME EXERCISE PROGRAM: ?PT has HEP from MD for gastroc stretching ?- Heel Raises with Leg Press  - 1-2 x daily - 7 x weekly - 4 reps - 20-30s hold ?  ?ASSESSMENT: ?  ?CLINICAL IMPRESSION: ?Patient tolerated treatment well during today's session, but did struggle to maintain proper ankle position during the isometric holds. The majority of today's treatment session was patient education the topics of which are detailed in the patient education section of this note. I did add the same isometric exercise performed during this session to the patient's HEP with the additional information that she must allow for at least 6 hour breaks between these workouts in order to allow for optimal tissue repair. She did give verbal acknowledgement of understanding. Of note, the patient did either  arrive or was delayed in checking in to today's appointment by ~7 minutes. ?  ?GOALS:          Goals reviewed with patient?Yes  ?  ?SHORT TERM GOALS: Target date: 08/28/2021 ?  ?1.Pt to be I in HEP to decrease pain to no greater than a 4/10  ?Baseline: 7 ?Goal status: ONGOING  ?  ?  ?LONG TERM GOALS: Target date: 09/18/2021 ?  ?Pt pain to be no greater than a 2/10 in her left achilles  ?Baseline: 7 ?Goal status: ONGOING  ?  ?2.  Pt to be able to return to jogging for 2 miles without pain increased beyond 2/10  in her Left achilles   ?Baseline: 7 ?Goal status: ONGOING  ?  ?3.  Pt to be able to go down steps without feeling a pull in her achilles tendon.  ?Baseline:  pt feels a pull ?Goal status: ONGOING  ?  ?  ?PLAN:             PT FREQUENCY: 2x/week ?  ?PT DURATION: 6 weeks ?  ?PLANNED INTERVENTIONS: Therapeutic exercises, Therapeutic activity, Gait training, Patient/Family education, Dry Needling, Moist heat, and Manual therapyk iontophoresis with dexamethasone.  ?  ?PLAN FOR NEXT SESSION: focus on manual and advance balance for stability , stretches  ? ? ?Creig Hines, PT ?08/24/2021, 8:22 AM ? ?  ? ?

## 2021-08-27 ENCOUNTER — Encounter (HOSPITAL_COMMUNITY): Payer: BC Managed Care – PPO | Admitting: Physical Therapy

## 2021-08-27 ENCOUNTER — Telehealth (HOSPITAL_COMMUNITY): Payer: Self-pay | Admitting: Physical Therapy

## 2021-08-27 NOTE — Telephone Encounter (Signed)
Pt did not show for appointment.  Called number given and no answer; voice mail box was full and could not leave a message. ? ?Lurena Nida, PTA/CLT, WTA ?[phone] ? ?

## 2021-08-30 ENCOUNTER — Encounter (HOSPITAL_COMMUNITY): Payer: BC Managed Care – PPO | Admitting: Physical Therapy

## 2021-08-30 ENCOUNTER — Telehealth (HOSPITAL_COMMUNITY): Payer: Self-pay | Admitting: Physical Therapy

## 2021-08-30 NOTE — Telephone Encounter (Signed)
2nd no show: ? ?PT called no answer unable to leave message. ? ?Virgina Organ, PT CLT ?[phone]  ?

## 2021-09-04 ENCOUNTER — Encounter (HOSPITAL_COMMUNITY): Payer: BC Managed Care – PPO | Admitting: Physical Therapy

## 2021-09-06 ENCOUNTER — Encounter (HOSPITAL_COMMUNITY): Payer: BC Managed Care – PPO | Admitting: Physical Therapy

## 2021-09-11 ENCOUNTER — Encounter (HOSPITAL_COMMUNITY): Payer: BC Managed Care – PPO | Admitting: Physical Therapy

## 2021-09-13 ENCOUNTER — Encounter (HOSPITAL_COMMUNITY): Payer: BC Managed Care – PPO | Admitting: Physical Therapy

## 2021-09-18 ENCOUNTER — Encounter (HOSPITAL_COMMUNITY): Payer: BC Managed Care – PPO

## 2021-09-20 ENCOUNTER — Encounter (HOSPITAL_COMMUNITY): Payer: BC Managed Care – PPO | Admitting: Physical Therapy

## 2021-10-10 ENCOUNTER — Other Ambulatory Visit: Payer: Self-pay | Admitting: Internal Medicine

## 2021-10-10 ENCOUNTER — Ambulatory Visit
Admission: RE | Admit: 2021-10-10 | Discharge: 2021-10-10 | Disposition: A | Discharge status: Home / Self Care | Payer: BC Managed Care – PPO | Source: Ambulatory Visit | Attending: Internal Medicine | Admitting: Internal Medicine

## 2021-10-10 DIAGNOSIS — K56600 Partial intestinal obstruction, unspecified as to cause: Secondary | ICD-10-CM | POA: Diagnosis not present

## 2021-10-10 DIAGNOSIS — D649 Anemia, unspecified: Secondary | ICD-10-CM | POA: Diagnosis not present

## 2021-10-10 DIAGNOSIS — K439 Ventral hernia without obstruction or gangrene: Secondary | ICD-10-CM | POA: Diagnosis not present

## 2021-10-10 DIAGNOSIS — K5669 Other partial intestinal obstruction: Secondary | ICD-10-CM | POA: Diagnosis not present

## 2021-10-24 DIAGNOSIS — M25561 Pain in right knee: Secondary | ICD-10-CM | POA: Diagnosis not present

## 2021-10-29 DIAGNOSIS — K432 Incisional hernia without obstruction or gangrene: Secondary | ICD-10-CM | POA: Diagnosis not present

## 2021-11-05 ENCOUNTER — Other Ambulatory Visit: Payer: Self-pay | Admitting: General Surgery

## 2021-11-05 DIAGNOSIS — K432 Incisional hernia without obstruction or gangrene: Secondary | ICD-10-CM

## 2021-11-08 ENCOUNTER — Encounter (HOSPITAL_COMMUNITY): Payer: Self-pay | Admitting: Physical Therapy

## 2021-11-08 NOTE — Therapy (Signed)
[place], [place], [address] Phone: [phone]   Fax:  [phone]  Patient Details  Name: Shelia Perez MRN: [id number] Date of Birth: 09-17-1970 Referring Provider:  No ref. provider found  Encounter Date: 11/08/2021 PHYSICAL THERAPY DISCHARGE SUMMARY  Visits from Start of Care: 3  Current functional level related to goals / functional outcomes: Unknown as pt did not return   Remaining deficits: Unknown as pt did not return     Education / Equipment: hep   Patient agrees to discharge. Patient goals were not met. Patient is being discharged due to not returning since the last visit.  Rayetta Humphrey, PT CLT [phone]  11/08/2021, 5:15 PM  [facility] [address] [place], [place], [address] Phone: [phone]   Fax:  [phone]

## 2021-11-12 ENCOUNTER — Ambulatory Visit
Admission: RE | Admit: 2021-11-12 | Discharge: 2021-11-12 | Disposition: A | Discharge status: Home / Self Care | Payer: BC Managed Care – PPO | Source: Ambulatory Visit | Attending: General Surgery | Admitting: General Surgery

## 2021-11-12 DIAGNOSIS — R1900 Intra-abdominal and pelvic swelling, mass and lump, unspecified site: Secondary | ICD-10-CM | POA: Diagnosis not present

## 2021-11-12 DIAGNOSIS — K429 Umbilical hernia without obstruction or gangrene: Secondary | ICD-10-CM | POA: Diagnosis not present

## 2021-11-12 DIAGNOSIS — N83202 Unspecified ovarian cyst, left side: Secondary | ICD-10-CM | POA: Diagnosis not present

## 2021-11-12 DIAGNOSIS — K439 Ventral hernia without obstruction or gangrene: Secondary | ICD-10-CM | POA: Diagnosis not present

## 2021-11-12 DIAGNOSIS — K432 Incisional hernia without obstruction or gangrene: Secondary | ICD-10-CM

## 2021-11-14 ENCOUNTER — Ambulatory Visit: Payer: Self-pay | Admitting: General Surgery

## 2021-11-14 DIAGNOSIS — K432 Incisional hernia without obstruction or gangrene: Secondary | ICD-10-CM | POA: Diagnosis not present

## 2021-11-14 NOTE — H&P (Signed)
History of Present Illness: Shelia Perez is a 51 y.o. female who is seen today for Follow-up after CT scan. Patient has had an incisional hernia.  Patient did undergo CT scan.   CT scan was reviewed.  This did appear to show an incisional hernia with loop of small bowel as well as a lower midline incisional hernia near the umbilicus. The area of the hernia appears to be 2 to 3 cm.  Compliances are probably a 6-8 cm hernia.   Patient states that the hernia feels to be more pronounced and bulging more frequently.         Review of Systems: A complete review of systems was obtained from the patient.  I have reviewed this information and discussed as appropriate with the patient.  See HPI as well for other ROS.   Review of Systems  Constitutional:  Negative for fever.  HENT:  Negative for congestion.   Eyes:  Negative for blurred vision.  Respiratory:  Negative for cough, shortness of breath and wheezing.   Cardiovascular:  Negative for chest pain and palpitations.  Gastrointestinal:  Negative for heartburn.  Genitourinary:  Negative for dysuria.  Musculoskeletal:  Negative for myalgias.  Skin:  Negative for rash.  Neurological:  Negative for dizziness and headaches.  Psychiatric/Behavioral:  Negative for depression and suicidal ideas.   All other systems reviewed and are negative.      Medical History: Past Medical History Past Medical History: Diagnosis Date  Anxiety    Hypertension        There is no problem list on file for this patient.     Past Surgical History Past Surgical History: Procedure Laterality Date  CESAREAN SECTION   2004  lower bowel obstruction   2013  HERNIA REPAIR          Allergies Allergies Allergen Reactions  Codeine Itching and Other (See Comments)     itching      Current Outpatient Medications on File Prior to Visit Medication Sig Dispense Refill  ergocalciferol, vitamin D2, 1,250 mcg (50,000 unit) capsule TAKE 1 CAPSULE BY  MOUTH 1 TIME EVERY WEEK      iron ps-iron hem poly-FA-B12 22-10-19-23 mg-mg-mg-mcg 1 capsule      magnesium citrate oral solution Take 296 mLs by mouth once      multivitamin tablet Take 1 tablet by mouth once daily      Saccharomyces boulardii (FLORASTOR) 250 mg capsule Take 250 mg by mouth 2 (two) times daily       No current facility-administered medications on file prior to visit.     Family History Family History Problem Relation Age of Onset  Obesity Mother    High blood pressure (Hypertension) Mother    Hyperlipidemia (Elevated cholesterol) Mother    Coronary Artery Disease (Blocked arteries around heart) Mother    Hyperlipidemia (Elevated cholesterol) Brother    High blood pressure (Hypertension) Brother        Social History   Tobacco Use Smoking Status Never Smokeless Tobacco Not on file     Social History Social History    Socioeconomic History  Marital status: Unknown Tobacco Use  Smoking status: Never Vaping Use  Vaping Use: Never used Substance and Sexual Activity  Alcohol use: Yes  Drug use: Never      Objective:     There were no vitals filed for this visit.  There is no height or weight on file to calculate BMI.   Physical Exam Constitutional:  Appearance: Normal appearance.  HENT:     Head: Normocephalic and atraumatic.     Mouth/Throat:     Mouth: Mucous membranes are moist.     Pharynx: Oropharynx is clear.  Eyes:     General: No scleral icterus.    Pupils: Pupils are equal, round, and reactive to light.  Cardiovascular:     Rate and Rhythm: Normal rate and regular rhythm.     Pulses: Normal pulses.     Heart sounds: No murmur heard.   No friction rub. No gallop.  Pulmonary:     Effort: Pulmonary effort is normal. No respiratory distress.     Breath sounds: Normal breath sounds. No stridor.  Abdominal:     General: Abdomen is flat.     Hernia: A hernia is present.     Musculoskeletal:        General: No swelling.   Skin:    General: Skin is warm.  Neurological:     General: No focal deficit present.     Mental Status: She is alert and oriented to person, place, and time. Mental status is at baseline.  Psychiatric:        Mood and Affect: Mood normal.        Thought Content: Thought content normal.        Judgment: Judgment normal.          Assessment and Plan:    Diagnoses and all orders for this visit:   Incisional hernia without obstruction or gangrene       51 year old female with an incisional hernia.   1.  We will proceed to the operating for a robotic incisional hernia repair with mesh.  We will likely be able to do this in an etep fashion. 2.  I discussed with her the risk and benefits of procedure to include but not limited to: Infection, bleeding, damage surrounding structures, possible recurrence.  Patient voiced understanding wished to proceed.   No follow-ups on file. Axel Filler, MD

## 2022-01-02 NOTE — Pre-Procedure Instructions (Signed)
Surgical Instructions    Your procedure is scheduled on Tuesday, August 22nd.  Report to [facility] Main Entrance "A" at 11:00 A.M., then check in with the Admitting office.  Call this number if you have problems the morning of surgery:  [phone]   If you have any questions prior to your surgery date call [phone]: Open Monday-Friday 8am-4pm    Remember:  Do not eat after midnight the night before your surgery  You may drink clear liquids until 10:00 AM the morning of your surgery.   Clear liquids allowed are: Water, Non-Citrus Juices (without pulp), Carbonated Beverages, Clear Tea, Black Coffee Only (NO MILK, CREAM OR POWDERED CREAMER of any kind), and Gatorade.   Patient Instructions  The night before surgery:  No food after midnight. ONLY clear liquids after midnight  The day of surgery (if you do NOT have diabetes):  Drink ONE (1) Pre-Surgery Clear Ensure by 10:00 AM the morning of surgery. Drink in one sitting. Do not sip.  This drink was given to you during your hospital  pre-op appointment visit.  Nothing else to drink after completing the  Pre-Surgery Clear Ensure.          If you have questions, please contact your surgeon's office.      Take these medicines the morning of surgery with A SIP OF WATER: none   As of today, STOP taking any Aspirin (unless otherwise instructed by your surgeon) Aleve, Naproxen, Ibuprofen, Motrin, Advil, Goody's, BC's, all herbal medications, fish oil, and all vitamins.                     Do NOT Smoke (Tobacco/Vaping) for 24 hours prior to your procedure.  If you use a CPAP at night, you may bring your mask/headgear for your overnight stay.   Contacts, glasses, piercing's, hearing aid's, dentures or partials may not be worn into surgery, please bring cases for these belongings.    For patients admitted to the hospital, discharge time will be determined by your treatment team.   Patients discharged the day of surgery will  not be allowed to drive home, and someone needs to stay with them for 24 hours.  SURGICAL WAITING ROOM VISITATION Patients having surgery or a procedure may have no more than 2 support people in the waiting area - these visitors may rotate.   Children under the age of 64 must have an adult with them who is not the patient. If the patient needs to stay at the hospital during part of their recovery, the visitor guidelines for inpatient rooms apply. Pre-op nurse will coordinate an appropriate time for 1 support person to accompany patient in pre-op.  This support person may not rotate.   Please refer to the [facility] website for the visitor guidelines for Inpatients (after your surgery is over and you are in a regular room).    Special instructions:   [place]- Preparing For Surgery  Before surgery, you can play an important role. Because skin is not sterile, your skin needs to be as free of germs as possible. You can reduce the number of germs on your skin by washing with CHG (chlorahexidine gluconate) Soap before surgery.  CHG is an antiseptic cleaner which kills germs and bonds with the skin to continue killing germs even after washing.    Oral Hygiene is also important to reduce your risk of infection.  Remember - BRUSH YOUR TEETH THE MORNING OF SURGERY WITH YOUR REGULAR TOOTHPASTE  Please do not use if you have an allergy to CHG or antibacterial soaps. If your skin becomes reddened/irritated stop using the CHG.  Do not shave (including legs and underarms) for at least 48 hours prior to first CHG shower. It is OK to shave your face.  Please follow these instructions carefully.   Shower the NIGHT BEFORE SURGERY and the MORNING OF SURGERY  If you chose to wash your hair, wash your hair first as usual with your normal shampoo.  After you shampoo, rinse your hair and body thoroughly to remove the shampoo.  Use CHG Soap as you would any other liquid soap. You can apply CHG directly to  the skin and wash gently with a scrungie or a clean washcloth.   Apply the CHG Soap to your body ONLY FROM THE NECK DOWN.  Do not use on open wounds or open sores. Avoid contact with your eyes, ears, mouth and genitals (private parts). Wash Face and genitals (private parts)  with your normal soap.   Wash thoroughly, paying special attention to the area where your surgery will be performed.  Thoroughly rinse your body with warm water from the neck down.  DO NOT shower/wash with your normal soap after using and rinsing off the CHG Soap.  Pat yourself dry with a CLEAN TOWEL.  Wear CLEAN PAJAMAS to bed the night before surgery  Place CLEAN SHEETS on your bed the night before your surgery  DO NOT SLEEP WITH PETS.   Day of Surgery: Take a shower with CHG soap. Do not wear jewelry or makeup Do not wear lotions, powders, perfumes, or deodorant. Do not shave 48 hours prior to surgery.   Do not bring valuables to the hospital. [facility] is not responsible for any belongings or valuables. Do not wear nail polish, gel polish, artificial nails, or any other type of covering on natural nails (fingers and toes) If you have artificial nails or gel coating that need to be removed by a nail salon, please have this removed prior to surgery. Artificial nails or gel coating may interfere with anesthesia's ability to adequately monitor your vital signs. Wear Clean/Comfortable clothing the morning of surgery Remember to brush your teeth WITH YOUR REGULAR TOOTHPASTE.   Please read over the following fact sheets that you were given.    If you received a COVID test during your pre-op visit  it is requested that you wear a mask when out in public, stay away from anyone that may not be feeling well and notify your surgeon if you develop symptoms. If you have been in contact with anyone that has tested positive in the last 10 days please notify you surgeon.

## 2022-01-03 ENCOUNTER — Encounter (HOSPITAL_COMMUNITY)
Admission: RE | Admit: 2022-01-03 | Discharge: 2022-01-03 | Disposition: A | Discharge status: Home / Self Care | Payer: BC Managed Care – PPO | Source: Ambulatory Visit | Attending: General Surgery | Admitting: General Surgery

## 2022-01-03 ENCOUNTER — Other Ambulatory Visit: Payer: Self-pay

## 2022-01-03 ENCOUNTER — Encounter (HOSPITAL_COMMUNITY): Payer: Self-pay

## 2022-01-03 VITALS — BP 112/63 | HR 64 | Temp 98.2°F | Resp 17 | Ht 64.5 in | Wt 187.5 lb

## 2022-01-03 DIAGNOSIS — Q231 Congenital insufficiency of aortic valve: Secondary | ICD-10-CM | POA: Diagnosis not present

## 2022-01-03 DIAGNOSIS — Z9884 Bariatric surgery status: Secondary | ICD-10-CM | POA: Insufficient documentation

## 2022-01-03 DIAGNOSIS — Z01812 Encounter for preprocedural laboratory examination: Secondary | ICD-10-CM | POA: Diagnosis not present

## 2022-01-03 DIAGNOSIS — D508 Other iron deficiency anemias: Secondary | ICD-10-CM | POA: Diagnosis not present

## 2022-01-03 DIAGNOSIS — Z01818 Encounter for other preprocedural examination: Secondary | ICD-10-CM

## 2022-01-03 LAB — BASIC METABOLIC PANEL
Anion gap: 6 (ref 5–15)
BUN: 10 mg/dL (ref 6–20)
CO2: 22 mmol/L (ref 22–32)
Calcium: 8.2 mg/dL — ABNORMAL LOW (ref 8.9–10.3)
Chloride: 107 mmol/L (ref 98–111)
Creatinine, Ser: 0.56 mg/dL (ref 0.44–1.00)
GFR, Estimated: >60 mL/min (ref >60)
Glucose, Bld: 91 mg/dL (ref 70–99)
Potassium: 4.1 mmol/L (ref 3.5–5.1)
Sodium: 135 mmol/L (ref 135–145)

## 2022-01-03 LAB — CBC
HCT: 34.3 % — ABNORMAL LOW (ref 36.0–46.0)
Hemoglobin: 10.3 g/dL — ABNORMAL LOW (ref 12.0–15.0)
MCH: 26.5 pg (ref 26.0–34.0)
MCHC: 30 g/dL (ref 30.0–36.0)
MCV: 88.2 fL (ref 80.0–100.0)
Platelets: 305 10*3/uL (ref 150–400)
RBC: 3.89 MIL/uL (ref 3.87–5.11)
RDW: 15.7 % — ABNORMAL HIGH (ref 11.5–15.5)
WBC: 5.1 10*3/uL (ref 4.0–10.5)
nRBC: 0 % (ref 0.0–0.2)

## 2022-01-03 NOTE — Progress Notes (Signed)
PCP - Dr. Lorenda Ishihara Cardiologist - denies  PPM/ICD - denies   Chest x-ray - 10/10/21 (Abd 2V + chest 1V) EKG - 06/09/16 Stress Test - denies ECHO - denies Cardiac Cath - denies  Sleep Study - sleep study around 2007, OSA- CPAP - n/a  DM- denies  ASA/Blood Thinner Instructions: n/a   ERAS Protcol - yes PRE-SURGERY Ensure given at PAT  COVID TEST- n/a   Anesthesia review: yes, Fayrene Fearing saw pt in PAT. Pt has bicuspid aortic valve. No record of Echo.  Patient denies shortness of breath, fever, cough and chest pain at PAT appointment   All instructions explained to the patient, with a verbal understanding of the material. Patient agrees to go over the instructions while at home for a better understanding. The opportunity to ask questions was provided.

## 2022-01-04 ENCOUNTER — Other Ambulatory Visit (HOSPITAL_COMMUNITY): Payer: Self-pay | Admitting: Internal Medicine

## 2022-01-04 ENCOUNTER — Encounter (HOSPITAL_COMMUNITY): Payer: Self-pay

## 2022-01-04 ENCOUNTER — Ambulatory Visit (INDEPENDENT_AMBULATORY_CARE_PROVIDER_SITE_OTHER): Payer: BC Managed Care – PPO

## 2022-01-04 DIAGNOSIS — Z01810 Encounter for preprocedural cardiovascular examination: Secondary | ICD-10-CM

## 2022-01-04 DIAGNOSIS — Q231 Congenital insufficiency of aortic valve: Secondary | ICD-10-CM | POA: Diagnosis not present

## 2022-01-04 LAB — ECHOCARDIOGRAM COMPLETE
AR max vel: 2.64 cm2
AV Area VTI: 2.38 cm2
AV Area mean vel: 2.1 cm2
AV Mean grad: 3 mmHg
AV Peak grad: 5.7 mmHg
Ao pk vel: 1.19 m/s
Area-P 1/2: 2.39 cm2
Calc EF: 70.8 %
S' Lateral: 2.35 cm
Single Plane A2C EF: 70.4 %
Single Plane A4C EF: 74.1 %

## 2022-01-04 NOTE — Anesthesia Preprocedure Evaluation (Signed)
Anesthesia Evaluation Anesthesia Physical Anesthesia Plan  ASA:   Anesthesia Plan:    Post-op Pain Management:    Induction:   PONV Risk Score and Plan:   Airway Management Planned:   Additional Equipment:   Intra-op Plan:   Post-operative Plan:   Informed Consent:   Plan Discussed with:   Anesthesia Plan Comments: (PAT note by Antionette Poles, PA-C: Patient reported at preop visit that she had history of echocardiogram done in 2007 as part of preop work-up before undergoing bariatric surgery.  She says that the echo at that time to dentally showed bicuspid aortic valve with no other abnormalities.  She has not had any cardiac follow-up since that time.  She reports no history of chest pain, palpitations, syncope.  She did undergo bariatric surgery with subsequent significant weight loss.  She continues to exercise regularly at the gym with both resistance and endurance training and reports no symptoms with this.  On exam she is well-appearing, heart regular rate and rhythm with no murmurs appreciated.  Lungs CTAB.  No lower extremity edema.  Discussed with patient it was recommended she have repeat echo prior to surgery to recharacterize her valve anatomy and make sure there have been no Diette deleterious changes.  Patient understood and was in agreement.  I reached out to her PCP at [facility] internal medicine to help facilitate.  She was seen by Evangeline Dakin, PA-C on 01/03/2022 and stat echo was ordered which was done on 01/04/2022.  Fortunately, echo showed aortic valve to be tricuspid with no abnormalities.  EF 65 to 70% and otherwise normal echo.  I informed the patient as well as the surgeons office of these results.  Anticipate she can proceed with surgery as planned.  Labs reviewed, mild anemia with hemoglobin 10.3, otherwise unremarkable.  TTE 01/04/2022: 1. Left ventricular ejection fraction, by estimation, is 65 to 70%. The  left ventricle has normal function. The left ventricle  has no regional  wall motion abnormalities. Left ventricular diastolic parameters were  normal. The average left ventricular  global longitudinal strain is -22.9 %. The global longitudinal strain is  normal.  2. Right ventricular systolic function is normal. The right ventricular  size is normal.  3. Left atrial size was moderately dilated.  4. The mitral valve is normal in structure. Trivial mitral valve  regurgitation. No evidence of mitral stenosis.  5. Aortic valve is clearly tricuspid. The aortic valve is tricuspid.  Aortic valve regurgitation is not visualized. No aortic stenosis is  present.  6. The inferior vena cava is normal in size with greater than 50%  respiratory variability, suggesting right atrial pressure of 3 mmHg.   )        Anesthesia Quick Evaluation

## 2022-01-04 NOTE — Progress Notes (Signed)
Anesthesia Chart Review:  Patient reported at preop visit that she had history of echocardiogram done in 2007 as part of preop work-up before undergoing bariatric surgery.  She says that the echo at that time to dentally showed bicuspid aortic valve with no other abnormalities.  She has not had any cardiac follow-up since that time.  She reports no history of chest pain, palpitations, syncope.  She did undergo bariatric surgery with subsequent significant weight loss.  She continues to exercise regularly at the gym with both resistance and endurance training and reports no symptoms with this.  On exam she is well-appearing, heart regular rate and rhythm with no murmurs appreciated.  Lungs CTAB.  No lower extremity edema.  Discussed with patient it was recommended she have repeat echo prior to surgery to recharacterize her valve anatomy and make sure there have been no Diette deleterious changes.  Patient understood and was in agreement.  I reached out to her PCP at [facility] internal medicine to help facilitate.  She was seen by Evangeline Dakin, PA-C on 01/03/2022 and stat echo was ordered which was done on 01/04/2022.  Fortunately, echo showed aortic valve to be tricuspid with no abnormalities.  EF 65 to 70% and otherwise normal echo.  I informed the patient as well as the surgeons office of these results.  Anticipate she can proceed with surgery as planned.  Labs reviewed, mild anemia with hemoglobin 10.3, otherwise unremarkable.  TTE 01/04/2022: 1. Left ventricular ejection fraction, by estimation, is 65 to 70%. The  left ventricle has normal function. The left ventricle has no regional  wall motion abnormalities. Left ventricular diastolic parameters were  normal. The average left ventricular  global longitudinal strain is -22.9 %. The global longitudinal strain is  normal.   2. Right ventricular systolic function is normal. The right ventricular  size is normal.   3. Left atrial size was moderately  dilated.   4. The mitral valve is normal in structure. Trivial mitral valve  regurgitation. No evidence of mitral stenosis.   5. Aortic valve is clearly tricuspid. The aortic valve is tricuspid.  Aortic valve regurgitation is not visualized. No aortic stenosis is  present.   6. The inferior vena cava is normal in size with greater than 50%  respiratory variability, suggesting right atrial pressure of 3 mmHg.     Zannie Cove [facility] Short Stay Center/Anesthesiology Phone [phone] 01/04/2022 4:54 PM

## 2022-01-08 ENCOUNTER — Encounter (HOSPITAL_COMMUNITY): Payer: Self-pay | Admitting: General Surgery

## 2022-01-08 ENCOUNTER — Ambulatory Visit (HOSPITAL_COMMUNITY): Payer: BC Managed Care – PPO | Admitting: Physician Assistant

## 2022-01-08 ENCOUNTER — Observation Stay (HOSPITAL_COMMUNITY)
Admission: RE | Admit: 2022-01-08 | Discharge: 2022-01-09 | Disposition: A | Discharge status: Home / Self Care | Payer: BC Managed Care – PPO | Attending: General Surgery | Admitting: General Surgery

## 2022-01-08 ENCOUNTER — Other Ambulatory Visit (HOSPITAL_COMMUNITY): Payer: Self-pay | Admitting: Internal Medicine

## 2022-01-08 ENCOUNTER — Other Ambulatory Visit: Payer: Self-pay

## 2022-01-08 ENCOUNTER — Encounter (HOSPITAL_COMMUNITY)
Admission: RE | Disposition: A | Discharge status: Home / Self Care | Payer: Self-pay | Source: Home / Self Care | Attending: General Surgery

## 2022-01-08 DIAGNOSIS — K432 Incisional hernia without obstruction or gangrene: Principal | ICD-10-CM | POA: Insufficient documentation

## 2022-01-08 DIAGNOSIS — Z79899 Other long term (current) drug therapy: Secondary | ICD-10-CM | POA: Diagnosis not present

## 2022-01-08 DIAGNOSIS — I10 Essential (primary) hypertension: Secondary | ICD-10-CM | POA: Insufficient documentation

## 2022-01-08 DIAGNOSIS — Q231 Congenital insufficiency of aortic valve: Secondary | ICD-10-CM

## 2022-01-08 DIAGNOSIS — Z8719 Personal history of other diseases of the digestive system: Secondary | ICD-10-CM

## 2022-01-08 DIAGNOSIS — Z01818 Encounter for other preprocedural examination: Secondary | ICD-10-CM

## 2022-01-08 HISTORY — PX: INSERTION OF MESH: SHX5868

## 2022-01-08 HISTORY — PX: XI ROBOTIC ASSISTED VENTRAL HERNIA: SHX6789

## 2022-01-08 LAB — POCT PREGNANCY, URINE: Preg Test, Ur: NEGATIVE

## 2022-01-08 SURGERY — REPAIR, HERNIA, VENTRAL, ROBOT-ASSISTED
Anesthesia: General | Site: Abdomen

## 2022-01-08 MED ORDER — FENTANYL CITRATE (PF) 250 MCG/5ML IJ SOLN
INTRAMUSCULAR | Status: AC
  Filled 2022-01-08: qty 5

## 2022-01-08 MED ORDER — ONDANSETRON HCL 4 MG/2ML IJ SOLN
INTRAMUSCULAR | Status: DC | PRN
  Administered 2022-01-08: 4 mg via INTRAVENOUS

## 2022-01-08 MED ORDER — STERILE WATER FOR IRRIGATION IR SOLN
Status: DC | PRN
  Administered 2022-01-08: 1000 mL

## 2022-01-08 MED ORDER — BUPIVACAINE LIPOSOME 1.3 % IJ SUSP
INTRAMUSCULAR | Status: AC
  Filled 2022-01-08: qty 20

## 2022-01-08 MED ORDER — FENTANYL CITRATE (PF) 100 MCG/2ML IJ SOLN
INTRAMUSCULAR | Status: DC | PRN
  Administered 2022-01-08: 100 ug via INTRAVENOUS
  Administered 2022-01-08 (×3): 50 ug via INTRAVENOUS
  Administered 2022-01-08: 100 ug via INTRAVENOUS
  Administered 2022-01-08: 50 ug via INTRAVENOUS

## 2022-01-08 MED ORDER — ONDANSETRON 4 MG PO TBDP: 4.0000 mg | ORAL_TABLET | Freq: Four times a day (QID) | ORAL | Status: DC | PRN

## 2022-01-08 MED ORDER — LACTATED RINGERS IV SOLN: INTRAVENOUS | Status: DC

## 2022-01-08 MED ORDER — FENTANYL CITRATE (PF) 100 MCG/2ML IJ SOLN
25.0000 ug | INTRAMUSCULAR | Status: DC | PRN
  Administered 2022-01-08 (×3): 50 ug via INTRAVENOUS

## 2022-01-08 MED ORDER — VISTASEAL 10 ML SINGLE DOSE KIT
PACK | CUTANEOUS | Status: DC | PRN
  Administered 2022-01-08: 10 mL via TOPICAL

## 2022-01-08 MED ORDER — CHLORHEXIDINE GLUCONATE CLOTH 2 % EX PADS: 6.0000 | MEDICATED_PAD | Freq: Once | CUTANEOUS | Status: DC

## 2022-01-08 MED ORDER — HYDROMORPHONE HCL 1 MG/ML IJ SOLN
1.0000 mg | INTRAMUSCULAR | Status: DC | PRN
  Administered 2022-01-08 – 2022-01-09 (×2): 1 mg via INTRAVENOUS
  Filled 2022-01-08 (×2): qty 1

## 2022-01-08 MED ORDER — KETOROLAC TROMETHAMINE 30 MG/ML IJ SOLN: 30.0000 mg | Freq: Four times a day (QID) | INTRAMUSCULAR | Status: DC | PRN

## 2022-01-08 MED ORDER — PROPOFOL 10 MG/ML IV BOLUS
INTRAVENOUS | Status: DC | PRN
  Administered 2022-01-08: 30 mg via INTRAVENOUS
  Administered 2022-01-08: 150 mg via INTRAVENOUS

## 2022-01-08 MED ORDER — OXYCODONE HCL 5 MG/5ML PO SOLN
5.0000 mg | Freq: Once | ORAL | Status: AC | PRN
  Administered 2022-01-08: 5 mg via ORAL

## 2022-01-08 MED ORDER — FENTANYL CITRATE (PF) 100 MCG/2ML IJ SOLN
INTRAMUSCULAR | Status: AC
  Filled 2022-01-08: qty 2

## 2022-01-08 MED ORDER — ONDANSETRON HCL 4 MG/2ML IJ SOLN
4.0000 mg | Freq: Four times a day (QID) | INTRAMUSCULAR | Status: DC | PRN
  Administered 2022-01-09: 4 mg via INTRAVENOUS
  Filled 2022-01-08: qty 2

## 2022-01-08 MED ORDER — OXYCODONE HCL 5 MG/5ML PO SOLN
ORAL | Status: AC
  Filled 2022-01-08: qty 5

## 2022-01-08 MED ORDER — CELECOXIB 200 MG PO CAPS
200.0000 mg | ORAL_CAPSULE | Freq: Once | ORAL | Status: AC
  Administered 2022-01-08: 200 mg via ORAL

## 2022-01-08 MED ORDER — CEFAZOLIN SODIUM-DEXTROSE 2-4 GM/100ML-% IV SOLN
INTRAVENOUS | Status: AC
  Filled 2022-01-08: qty 100

## 2022-01-08 MED ORDER — OXYCODONE HCL 5 MG PO TABS: 5.0000 mg | ORAL_TABLET | Freq: Once | ORAL | Status: AC | PRN

## 2022-01-08 MED ORDER — CHLORHEXIDINE GLUCONATE 0.12 % MT SOLN
15.0000 mL | Freq: Once | OROMUCOSAL | Status: AC
  Administered 2022-01-08: 15 mL via OROMUCOSAL

## 2022-01-08 MED ORDER — CEFAZOLIN SODIUM-DEXTROSE 2-4 GM/100ML-% IV SOLN
2.0000 g | INTRAVENOUS | Status: AC
  Administered 2022-01-08: 2 g via INTRAVENOUS

## 2022-01-08 MED ORDER — LIDOCAINE 2% (20 MG/ML) 5 ML SYRINGE
INTRAMUSCULAR | Status: AC
  Filled 2022-01-08: qty 5

## 2022-01-08 MED ORDER — DEXTROSE-NACL 5-0.9 % IV SOLN: INTRAVENOUS | Status: DC

## 2022-01-08 MED ORDER — ONDANSETRON HCL 4 MG/2ML IJ SOLN
INTRAMUSCULAR | Status: AC
  Filled 2022-01-08: qty 2

## 2022-01-08 MED ORDER — PROPOFOL 10 MG/ML IV BOLUS
INTRAVENOUS | Status: AC
  Filled 2022-01-08: qty 20

## 2022-01-08 MED ORDER — ACETAMINOPHEN 500 MG PO TABS
ORAL_TABLET | ORAL | Status: AC
  Filled 2022-01-08: qty 2

## 2022-01-08 MED ORDER — DEXAMETHASONE SODIUM PHOSPHATE 10 MG/ML IJ SOLN
INTRAMUSCULAR | Status: AC
  Filled 2022-01-08: qty 1

## 2022-01-08 MED ORDER — 0.9 % SODIUM CHLORIDE (POUR BTL) OPTIME
TOPICAL | Status: DC | PRN
  Administered 2022-01-08: 1000 mL

## 2022-01-08 MED ORDER — MIDAZOLAM HCL 2 MG/2ML IJ SOLN
INTRAMUSCULAR | Status: AC
  Filled 2022-01-08: qty 2

## 2022-01-08 MED ORDER — CELECOXIB 200 MG PO CAPS
ORAL_CAPSULE | ORAL | Status: AC
  Filled 2022-01-08: qty 1

## 2022-01-08 MED ORDER — BUPIVACAINE HCL 0.25 % IJ SOLN
INTRAMUSCULAR | Status: DC | PRN
  Administered 2022-01-08: 6 mL

## 2022-01-08 MED ORDER — DEXAMETHASONE SODIUM PHOSPHATE 10 MG/ML IJ SOLN
INTRAMUSCULAR | Status: DC | PRN
  Administered 2022-01-08: 5 mg via INTRAVENOUS

## 2022-01-08 MED ORDER — ROCURONIUM BROMIDE 10 MG/ML (PF) SYRINGE
PREFILLED_SYRINGE | INTRAVENOUS | Status: DC | PRN
  Administered 2022-01-08: 10 mg via INTRAVENOUS
  Administered 2022-01-08: 60 mg via INTRAVENOUS
  Administered 2022-01-08: 10 mg via INTRAVENOUS
  Administered 2022-01-08: 20 mg via INTRAVENOUS

## 2022-01-08 MED ORDER — OXYCODONE HCL 5 MG PO TABS
5.0000 mg | ORAL_TABLET | ORAL | Status: DC | PRN
  Administered 2022-01-08 – 2022-01-09 (×4): 10 mg via ORAL
  Filled 2022-01-08 (×4): qty 2

## 2022-01-08 MED ORDER — SODIUM CHLORIDE (PF) 0.9 % IJ SOLN
INTRAMUSCULAR | Status: DC | PRN
  Administered 2022-01-08: 40 mL

## 2022-01-08 MED ORDER — BUPIVACAINE HCL (PF) 0.25 % IJ SOLN
INTRAMUSCULAR | Status: AC
  Filled 2022-01-08: qty 30

## 2022-01-08 MED ORDER — ORAL CARE MOUTH RINSE: 15.0000 mL | Freq: Once | OROMUCOSAL | Status: AC

## 2022-01-08 MED ORDER — SUGAMMADEX SODIUM 200 MG/2ML IV SOLN
INTRAVENOUS | Status: DC | PRN
  Administered 2022-01-08: 200 mg via INTRAVENOUS

## 2022-01-08 MED ORDER — MIDAZOLAM HCL 2 MG/2ML IJ SOLN
INTRAMUSCULAR | Status: DC | PRN
  Administered 2022-01-08: 2 mg via INTRAVENOUS

## 2022-01-08 MED ORDER — CHLORHEXIDINE GLUCONATE 0.12 % MT SOLN
OROMUCOSAL | Status: AC
  Filled 2022-01-08: qty 15

## 2022-01-08 MED ORDER — VISTASEAL 10 ML SINGLE DOSE KIT
10.0000 mL | PACK | Freq: Once | CUTANEOUS | Status: DC
  Filled 2022-01-08: qty 10

## 2022-01-08 MED ORDER — ROCURONIUM BROMIDE 10 MG/ML (PF) SYRINGE
PREFILLED_SYRINGE | INTRAVENOUS | Status: AC
  Filled 2022-01-08: qty 10

## 2022-01-08 MED ORDER — LIDOCAINE 2% (20 MG/ML) 5 ML SYRINGE
INTRAMUSCULAR | Status: DC | PRN
  Administered 2022-01-08: 60 mg via INTRAVENOUS

## 2022-01-08 MED ORDER — PROMETHAZINE HCL 25 MG/ML IJ SOLN: 6.2500 mg | INTRAMUSCULAR | Status: DC | PRN

## 2022-01-08 MED ORDER — ACETAMINOPHEN 500 MG PO TABS
1000.0000 mg | ORAL_TABLET | Freq: Once | ORAL | Status: AC
Start: 2022-01-08 — End: 2022-01-08
  Administered 2022-01-08: 1000 mg via ORAL

## 2022-01-08 MED ORDER — KETOROLAC TROMETHAMINE 30 MG/ML IJ SOLN
30.0000 mg | Freq: Four times a day (QID) | INTRAMUSCULAR | Status: AC
  Administered 2022-01-08: 30 mg via INTRAVENOUS
  Filled 2022-01-08: qty 1

## 2022-01-08 SURGICAL SUPPLY — 65 items
APPLICATOR VISTASEAL 35 (MISCELLANEOUS) IMPLANT
BAG COUNTER SPONGE SURGICOUNT (BAG) IMPLANT
BINDER ABDOMINAL 10 UNV 27-48 (MISCELLANEOUS) IMPLANT
BINDER ABDOMINAL 12 ML 46-62 (SOFTGOODS) IMPLANT
BIOPATCH RED 1 DISK 7.0 (GAUZE/BANDAGES/DRESSINGS) IMPLANT
BLADE SURG 10 STRL SS (BLADE) IMPLANT
CHLORAPREP W/TINT 26 (MISCELLANEOUS) ×2 IMPLANT
COVER MAYO STAND STRL (DRAPES) ×2 IMPLANT
COVER SURGICAL LIGHT HANDLE (MISCELLANEOUS) ×2 IMPLANT
COVER TIP SHEARS 8 DVNC (MISCELLANEOUS) ×2 IMPLANT
COVER TIP SHEARS 8MM DA VINCI (MISCELLANEOUS) ×2
DEFOGGER SCOPE WARMER CLEARIFY (MISCELLANEOUS) ×2 IMPLANT
DERMABOND ADVANCED (GAUZE/BANDAGES/DRESSINGS) ×4
DERMABOND ADVANCED .7 DNX12 (GAUZE/BANDAGES/DRESSINGS) ×2 IMPLANT
DEVICE SECURE STRAP 25 ABSORB (INSTRUMENTS) IMPLANT
DEVICE TROCAR PUNCTURE CLOSURE (ENDOMECHANICALS) ×2 IMPLANT
DRAIN CHANNEL 19F RND (DRAIN) IMPLANT
DRAPE ARM DVNC X/XI (DISPOSABLE) ×8 IMPLANT
DRAPE COLUMN DVNC XI (DISPOSABLE) ×2 IMPLANT
DRAPE CV SPLIT W-CLR ANES SCRN (DRAPES) ×2 IMPLANT
DRAPE DA VINCI XI ARM (DISPOSABLE) ×8
DRAPE DA VINCI XI COLUMN (DISPOSABLE) ×2
DRAPE ORTHO SPLIT 77X108 STRL (DRAPES) ×2
DRAPE SURG ORHT 6 SPLT 77X108 (DRAPES) ×2 IMPLANT
DRSG TEGADERM 4X4.75 (GAUZE/BANDAGES/DRESSINGS) IMPLANT
ELECT CAUTERY BLADE 6.4 (BLADE) IMPLANT
EVACUATOR SILICONE 100CC (DRAIN) IMPLANT
GLOVE BIO SURGEON STRL SZ7.5 (GLOVE) ×4 IMPLANT
GLOVE PROTEXIS LATEX SZ 7.5 (GLOVE) IMPLANT
GLOVE SURG LATEX 7.5 PF (GLOVE) ×4 IMPLANT
GOWN STRL REUS W/ TWL LRG LVL3 (GOWN DISPOSABLE) ×4 IMPLANT
GOWN STRL REUS W/ TWL XL LVL3 (GOWN DISPOSABLE) ×4 IMPLANT
GOWN STRL REUS W/TWL 2XL LVL3 (GOWN DISPOSABLE) ×2 IMPLANT
GOWN STRL REUS W/TWL LRG LVL3 (GOWN DISPOSABLE) ×4
GOWN STRL REUS W/TWL XL LVL3 (GOWN DISPOSABLE) ×4
IRRIG SUCT STRYKERFLOW 2 WTIP (MISCELLANEOUS)
IRRIGATION SUCT STRKRFLW 2 WTP (MISCELLANEOUS) IMPLANT
KIT BASIN OR (CUSTOM PROCEDURE TRAY) ×2 IMPLANT
KIT TURNOVER KIT B (KITS) ×2 IMPLANT
MARKER SKIN DUAL TIP RULER LAB (MISCELLANEOUS) ×2 IMPLANT
MESH PROLENE PML 12X12 (Mesh General) IMPLANT
NEEDLE HYPO 22GX1.5 SAFETY (NEEDLE) ×2 IMPLANT
PAD ARMBOARD 7.5X6 YLW CONV (MISCELLANEOUS) ×4 IMPLANT
PENCIL SMOKE EVACUATOR (MISCELLANEOUS) IMPLANT
RETAINER VISCERA MED (MISCELLANEOUS) IMPLANT
SCISSORS LAP 5X35 DISP (ENDOMECHANICALS) IMPLANT
SEAL CANN UNIV 5-8 DVNC XI (MISCELLANEOUS) ×6 IMPLANT
SEAL XI 5MM-8MM UNIVERSAL (MISCELLANEOUS) ×6
SET TUBE SMOKE EVAC HIGH FLOW (TUBING) ×2 IMPLANT
STOPCOCK 4 WAY LG BORE MALE ST (IV SETS) ×2 IMPLANT
SUT DVC VLOC 180 0 12IN GS21 (SUTURE)
SUT ETHILON 2 0 FS 18 (SUTURE) IMPLANT
SUT MNCRL AB 4-0 PS2 18 (SUTURE) ×2 IMPLANT
SUT NOVA NAB GS-21 0 18 T12 DT (SUTURE) IMPLANT
SUT PDS AB 1 CT1 36 (SUTURE) IMPLANT
SUT VLOC 180 0 9IN  GS21 (SUTURE)
SUT VLOC 180 0 9IN GS21 (SUTURE) IMPLANT
SUT VLOC 180 2-0 6IN GS21 (SUTURE) ×2 IMPLANT
SUT VLOC 180 2-0 9IN GS21 (SUTURE) IMPLANT
SUTURE DVC VLC 180 0 12IN GS21 (SUTURE) IMPLANT
TOWEL GREEN STERILE FF (TOWEL DISPOSABLE) ×2 IMPLANT
TRAY LAPAROSCOPIC MC (CUSTOM PROCEDURE TRAY) ×2 IMPLANT
TROCAR Z-THREAD OPTICAL 5X100M (TROCAR) IMPLANT
TUBE CONNECTING 20X1/4 (TUBING) IMPLANT
YANKAUER SUCT BULB TIP NO VENT (SUCTIONS) IMPLANT

## 2022-01-08 NOTE — Op Note (Signed)
01/08/2022  3:19 PM  PATIENT:  Shelia Perez  51 y.o. female  PRE-OPERATIVE DIAGNOSIS:  INCISIONAL HERNIA 6cm  POST-OPERATIVE DIAGNOSIS:  INCISIONAL HERNIA 6cm  PROCEDURE:  Procedure(s): ROBOTIC ASSISTED INCISIONAL HERNIA REPAIR WITH MESH (N/A) INSERTION OF MESH  SURGEON:  Surgeon(s) and Role:    * Axel Filler, MD - Primary    ASSISTANTS: Jeronimo Greaves, RNFA Slightly  ANESTHESIA:   local, regional, and general  EBL:  minimal   BLOOD ADMINISTERED:none  DRAINS: (19 [place]) Jackson-Pratt drain(s) with closed bulb suction in the retrorectus space  LOCAL MEDICATIONS USED:  BUPIVICAINE  and OTHER Exparel  SPECIMEN:  No Specimen  DISPOSITION OF SPECIMEN:  N/A  COUNTS:  YES  TOURNIQUET:  * No tourniquets in log *  DICTATION: .Dragon Dictation Findings: 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  This was repaired in the retrorectus fashion.  The mesh was then Prolene 15 x 13 cm piece of mesh.  This was anchored bilaterally using 0 Novafil's and superior to inferior lateral to the linea alba using 0 Novafil's.   After the patient was consented he was taken back to the operating room and placed in the supine position with bilateral SCDs in place. The patient was prepped and draped in usual sterile fashion. Antibiotics were confirmed and timeout was called and all facts verified.  An Optiview technique was used in the left subcostal margin.  I proceeded to enter the retrorectus space.  A space was created inferiorly.  Dissection was done bluntly.  Two 8 mm trocars then placed under direct visualization.  The 5 mm trocar was then replaced with an 8 mm trocar.  At this time proceeded to bluntly dissect away the the retrorectus space towards the midline linea alba.  The robot cart was brought to the bedside and docked.  I continue to incise the posterior rectus fascia inferiorly and superiorly.  Upon doing so superiorly I quickly entered  the abdominal cavity.  There was what appeared to be some small body into the intra-abdominal wall.  At this time the posterior rectus fascia was taken down from superior to inferior direction.  The small bowel adhesions were taken down sharply.  Once these were taken down it was easy to see the Swiss cheese defect.  At this time I proceeded with a open laparotomy.  At this time I proceeded to excise the skin scar. This was discarded. I proceeded to use electrocautery to maintain hemostasis and dissection took place in the most superior portion of the wound down to the subcutaneous tissues fat to the anterior fascia. This was incised. The fascia was elevated and 2 Kocher clamps.  The hernia sac and the abdominal cavity were entered bluntly. There appeared to be some omentum within the midline in the superior portion of the wound.   The fascia was incised distal to the hernia approximately 3 cm.  The hernia was seen to be approximately 12 cm in length in its entirety.  There was several Swiss cheese defects..    At this time I proceeded to retract is rectus muscles medially.  At this time the posterior fascia was then incised.   The muscle belly was very adherent to the posterior rectus fascia.  This was taken down with blunt dissection as well as cautery to maintain hemostasis. Using blunt dissection the belly was dissected away from the posterior rectus fascia.  This was done inferiorly and superiorly.  At the midline inferior  and superior portions of the linea alba this was taken down from the anterior abdominal wall.  This was done bilaterally.  The area was checked for hemostasis.  At this time the posterior rectus fascia was reapproximated using #1 PDS in a standard running fashion x2.  I proceeded to irrigate out the retrorectus space.  The area was measured and seemed to be approximately 15x13 cm in size.  A piece of Ethibond Prolene mesh was selected and cut to size and placed into the retrorectus  space.  This fit well and flat.  Vistaseal glue was then used to fasten the mesh to the midline fascia.  0 Novafil's were used bilaterally x2 with an Endo Close to place transfascial sutures.  This was also anchored to the linea alba superior to inferior in the midline using 0 Novafil's.  At this time the anterior fascia and midline were reapproximated using #1 PDS in a standard running fashion x2.  The subcutaneous tissue was irrigated out with sterile saline.  At this time a 59 [place] Blake drain was placed in the left lower quadrant above the mesh.  The skin was then reapproximated using 3-0 Monocryl in subcuticular fashion.  The skin was dressed with Dermabond.    The patient tolerated procedure well was taken to the recovery room in stable condition.    PLAN OF CARE: Admit for overnight observation  PATIENT DISPOSITION:  PACU - hemodynamically stable.   Delay start of Pharmacological VTE agent (>24hrs) due to surgical blood loss or risk of bleeding: yes

## 2022-01-08 NOTE — Transfer of Care (Signed)
Immediate Anesthesia Transfer of Care Note  Patient: Shelia Perez  Procedure(s) Performed: ROBOTIC ASSISTED INCISIONAL HERNIA REPAIR WITH MESH (Abdomen) INSERTION OF MESH (Abdomen)  Patient Location: PACU  Anesthesia Type:General  Level of Consciousness: drowsy and patient cooperative  Airway & Oxygen Therapy: Patient Spontanous Breathing and Patient connected to nasal cannula oxygen  Post-op Assessment: Report given to RN, Post -op Vital signs reviewed and stable and Patient moving all extremities  Post vital signs: Reviewed and stable  Last Vitals:  Vitals Value Taken Time  BP 145/84 01/08/22 1535  Temp    Pulse 85 01/08/22 1538  Resp 13 01/08/22 1538  SpO2 100 % 01/08/22 1538  Vitals shown include unvalidated device data.  Last Pain:  Vitals:   01/08/22 1128  TempSrc:   PainSc: 0-No pain      Patients Stated Pain Goal: 0 (01/08/22 1128)  Complications: No notable events documented.

## 2022-01-08 NOTE — H&P (Signed)
History of Present Illness: Shelia Perez is a 51 y.o. female who is seen today for Follow-up after CT scan. Patient has had an incisional hernia.  Patient did undergo CT scan.   CT scan was reviewed.  This did appear to show an incisional hernia with loop of small bowel as well as a lower midline incisional hernia near the umbilicus. The area of the hernia appears to be 2 to 3 cm.  Compliances are probably a 6-8 cm hernia.   Patient states that the hernia feels to be more pronounced and bulging more frequently.         Review of Systems: A complete review of systems was obtained from the patient.  I have reviewed this information and discussed as appropriate with the patient.  See HPI as well for other ROS.   Review of Systems  Constitutional:  Negative for fever.  HENT:  Negative for congestion.   Eyes:  Negative for blurred vision.  Respiratory:  Negative for cough, shortness of breath and wheezing.   Cardiovascular:  Negative for chest pain and palpitations.  Gastrointestinal:  Negative for heartburn.  Genitourinary:  Negative for dysuria.  Musculoskeletal:  Negative for myalgias.  Skin:  Negative for rash.  Neurological:  Negative for dizziness and headaches.  Psychiatric/Behavioral:  Negative for depression and suicidal ideas.   All other systems reviewed and are negative.      Medical History: Past Medical History Past Medical History: Diagnosis        Date            Anxiety                         Hypertension           There is no problem list on file for this patient.     Past Surgical History Past Surgical History: Procedure       Laterality         Date            CESAREAN SECTION                       2004            lower bowel obstruction                      2013            HERNIA REPAIR                            Allergies Allergies Allergen           Reactions            Codeine           Itching and Other (See Comments)                          itching       Current Outpatient Medications on File Prior to Visit Medication       Sig       Dispense         Refill            ergocalciferol, vitamin D2, 1,250 mcg (50,000 unit) capsule TAKE 1 CAPSULE BY MOUTH 1 TIME EVERY WEEK  iron ps-iron hem poly-FA-B12 22-10-19-23 mg-mg-mg-mcg     1 capsule                                  magnesium citrate oral solution          Take 296 mLs by mouth once                                     multivitamin tablet       Take 1 tablet by mouth once daily                               Saccharomyces boulardii (FLORASTOR) 250 mg capsule   Take 250 mg by mouth 2 (two) times daily                       No current facility-administered medications on file prior to visit.     Family History Family History Problem           Relation           Age of Onset            Obesity            Mother              High blood pressure (Hypertension)   Mother              Hyperlipidemia (Elevated cholesterol)            Mother              Coronary Artery Disease (Blocked arteries around heart)     Mother              Hyperlipidemia (Elevated cholesterol)            Brother                         High blood pressure (Hypertension)   Brother                    Social History   Tobacco Use Smoking Status           Never Smokeless Tobacco   Not on file     Social History Social History     Socioeconomic History            Marital status:  Unknown Tobacco Use            Smoking status:          Never Vaping Use            Vaping Use:    Never used Substance and Sexual Activity            Alcohol use:    Yes            Drug use:        Never       Objective:     There were no vitals filed for this visit.  There is no height or weight on file to calculate BMI.   Physical Exam Constitutional:      Appearance: Normal appearance.  HENT:     Head: Normocephalic and atraumatic.  Mouth/Throat:     Mouth: Mucous  membranes are moist.     Pharynx: Oropharynx is clear.  Eyes:     General: No scleral icterus.    Pupils: Pupils are equal, round, and reactive to light.  Cardiovascular:     Rate and Rhythm: Normal rate and regular rhythm.     Pulses: Normal pulses.     Heart sounds: No murmur heard.   No friction rub. No gallop.  Pulmonary:     Effort: Pulmonary effort is normal. No respiratory distress.     Breath sounds: Normal breath sounds. No stridor.  Abdominal:     General: Abdomen is flat.     Hernia: A hernia is present.      Musculoskeletal:        General: No swelling.  Skin:    General: Skin is warm.  Neurological:     General: No focal deficit present.     Mental Status: She is alert and oriented to person, place, and time. Mental status is at baseline.  Psychiatric:        Mood and Affect: Mood normal.        Thought Content: Thought content normal.        Judgment: Judgment normal.          Assessment and Plan:    Diagnoses and all orders for this visit:   Incisional hernia without obstruction or gangrene       51 year old female with an incisional hernia.   1.  We will proceed to the operating for a robotic incisional hernia repair with mesh.  We will likely be able to do this in an etep fashion. 2.  I discussed with her the risk and benefits of procedure to include but not limited to: Infection, bleeding, damage surrounding structures, possible recurrence.  Patient voiced understanding wished to proceed.   No follow-ups on file. Ralene Ok, MD

## 2022-01-08 NOTE — Interval H&P Note (Signed)
History and Physical Interval Note:  01/08/2022 12:38 PM  Shelia Perez  has presented today for surgery, with the diagnosis of INCISIONAL HERNIA 6cm.  The various methods of treatment have been discussed with the patient and family. After consideration of risks, benefits and other options for treatment, the patient has consented to  Procedure(s): XI ROBOTIC ASSISTED INCISIONAL HERNIA REPAIR WITH MESH ETEP (N/A) as a surgical intervention.  The patient's history has been reviewed, patient examined, no change in status, stable for surgery.  I have reviewed the patient's chart and labs.  Questions were answered to the patient's satisfaction.     Axel Filler

## 2022-01-08 NOTE — Anesthesia Procedure Notes (Signed)
Procedure Name: Intubation Date/Time: 01/08/2022 1:09 PM  Performed by: Moshe Salisbury, CRNAPre-anesthesia Checklist: Patient identified, Emergency Drugs available, Suction available and Patient being monitored Patient Re-evaluated:Patient Re-evaluated prior to induction Oxygen Delivery Method: Circle System Utilized Preoxygenation: Pre-oxygenation with 100% oxygen Induction Type: IV induction Ventilation: Mask ventilation without difficulty Laryngoscope Size: Mac and 3 Grade View: Grade I Tube type: Oral Tube size: 7.5 mm Number of attempts: 1 Airway Equipment and Method: Stylet Placement Confirmation: ETT inserted through vocal cords under direct vision, positive ETCO2 and breath sounds checked- equal and bilateral Secured at: 20 cm Tube secured with: Tape Dental Injury: Teeth and Oropharynx as per pre-operative assessment

## 2022-01-08 NOTE — Anesthesia Postprocedure Evaluation (Signed)
Anesthesia Post Note  Patient: Shelia Perez  Procedure(s) Performed: ROBOTIC ASSISTED INCISIONAL HERNIA REPAIR WITH MESH (Abdomen) INSERTION OF MESH (Abdomen)     Patient location during evaluation: PACU Anesthesia Type: General Level of consciousness: awake and alert Pain management: pain level controlled Vital Signs Assessment: post-procedure vital signs reviewed and stable Respiratory status: spontaneous breathing, nonlabored ventilation and respiratory function stable Cardiovascular status: stable and blood pressure returned to baseline Anesthetic complications: no   No notable events documented.  Last Vitals:  Vitals:   01/08/22 1600 01/08/22 1615  BP: (!) 154/92 (!) 143/87  Pulse: 73 76  Resp: 16 16  Temp:    SpO2: 100% 100%                  Beryle Lathe

## 2022-01-09 ENCOUNTER — Encounter (HOSPITAL_COMMUNITY): Payer: Self-pay | Admitting: General Surgery

## 2022-01-09 DIAGNOSIS — K432 Incisional hernia without obstruction or gangrene: Secondary | ICD-10-CM | POA: Diagnosis not present

## 2022-01-09 DIAGNOSIS — Z79899 Other long term (current) drug therapy: Secondary | ICD-10-CM | POA: Diagnosis not present

## 2022-01-09 DIAGNOSIS — I10 Essential (primary) hypertension: Secondary | ICD-10-CM | POA: Diagnosis not present

## 2022-01-09 MED ORDER — OXYCODONE HCL 5 MG PO TABS: 5.0000 mg | ORAL_TABLET | ORAL | 0 refills | Status: AC | PRN

## 2022-01-09 NOTE — Discharge Summary (Signed)
Physician Discharge Summary  Patient ID: Shelia Perez MRN: [id number] DOB/AGE: Feb 11, 1971 51 y.o.  Admit date: 01/08/2022 Discharge date: 01/09/2022  Admission Diagnoses:s/p hernia repair  Discharge Diagnoses:  Principal Problem:   S/P hernia repair   Discharged Condition: good  Hospital Course: patient was admitted postop.  Patient did well postoperatively.  She had good pain control.  She was tolerating p.o. well.  Patient was otherwise ambulating well on her own.  She was afebrile, deemed stable for discharge and discharged home.  Consults: None  Significant Diagnostic Studies: none  Treatments: surgery:  as above  Discharge Exam: Blood pressure 113/60, pulse 73, temperature 98.7 F (37.1 C), temperature source Oral, resp. rate 16, height 5' 4.5" (1.638 m), weight 81.2 kg, SpO2 95 %. General appearance: alert and cooperative GI: soft, non-tender; bowel sounds normal; no masses,  no organomegaly and inc c/d/i  Disposition: Discharge disposition: 01-Home or Self Care       Discharge Instructions     Diet - low sodium heart healthy   Complete by: As directed    Increase activity slowly   Complete by: As directed       Allergies as of 01/09/2022       Reactions   Codeine    itching        Medication List     TAKE these medications    meloxicam 15 MG tablet Commonly known as: MOBIC TAKE 1 TABLET(15 MG) BY MOUTH DAILY AS NEEDED FOR PAIN   multivitamin with minerals tablet Take 1 tablet by mouth daily.   oxyCODONE 5 MG immediate release tablet Commonly known as: Oxy IR/ROXICODONE Take 1-2 tablets (5-10 mg total) by mouth every 4 (four) hours as needed for moderate pain.        Follow-up Information     Axel Filler, MD Follow up.   Specialty: General Surgery Why: Nurse visit. Arrive at 0845 for 9AM appt.  For suture removal Contact information: [address] [place] [address] [phone]                  Signed: Axel Filler 01/09/2022, 1:26 PM

## 2022-01-09 NOTE — Progress Notes (Signed)
Discharge instructions, including JP drain maintenance, given. Pt transported off unit via WC with all belongings and family at side. She remains stable at baseline.

## 2022-01-09 NOTE — Progress Notes (Signed)
1 Day Post-Op   Subjective/Chief Complaint: PT doing well this AM Tol PO Mobilizing    Objective: Vital signs in last 24 hours: Temp:  [98 F (36.7 C)-99.4 F (37.4 C)] 98.7 F (37.1 C) (08/23 0751) Pulse Rate:  [73-81] 73 (08/23 0751) Resp:  [11-19] 16 (08/23 0751) BP: (113-154)/(60-103) 113/60 (08/23 0751) SpO2:  [92 %-100 %] 95 % (08/23 0751) Weight:  [81.2 kg] 81.2 kg (08/22 1121) Last BM Date : 01/07/22  Intake/Output from previous day: 08/22 0701 - 08/23 0700 In: 460 [P.O.:360; IV Piggyback:100] Out: 1160 [Urine:350; Drains:660; Blood:150] Intake/Output this shift: No intake/output data recorded.  General appearance: alert and cooperative GI: soft, non-tender; bowel sounds normal; no masses,  no organomegaly and inc c/d/i  Lab Results:  No results for input(s): "WBC", "HGB", "HCT", "PLT" in the last 72 hours. BMET No results for input(s): "NA", "K", "CL", "CO2", "GLUCOSE", "BUN", "CREATININE", "CALCIUM" in the last 72 hours. PT/INR No results for input(s): "LABPROT", "INR" in the last 72 hours. ABG No results for input(s): "PHART", "HCO3" in the last 72 hours.  Invalid input(s): "PCO2", "PO2"  Studies/Results: No results found.  Anti-infectives: Anti-infectives (From admission, onward)    Start     Dose/Rate Route Frequency Ordered Stop   01/08/22 1139  ceFAZolin (ANCEF) 2-4 GM/100ML-% IVPB       Note to Pharmacy: [name] M: cabinet override      01/08/22 1139 01/08/22 1313   01/08/22 1130  ceFAZolin (ANCEF) IVPB 2g/100 mL premix        2 g 200 mL/hr over 30 Minutes Intravenous On call to O.R. 01/08/22 1118 01/08/22 1259       Assessment/Plan: s/p Procedure(s): ROBOTIC ASSISTED INCISIONAL HERNIA REPAIR WITH MESH (N/A) INSERTION OF MESH Mobilize today  Jp drain education Home later today  LOS: 0 days    Shelia Perez 01/09/2022

## 2022-01-09 NOTE — Discharge Instructions (Signed)
[facility] _______[facility] ? ?INGUINAL HERNIA REPAIR: POST OP INSTRUCTIONS ? ?Always review your discharge instruction sheet given to you by the facility where your surgery was performed. ?IF YOU HAVE DISABILITY OR FAMILY LEAVE FORMS, YOU MUST BRING THEM TO THE OFFICE FOR PROCESSING.   ?DO NOT GIVE THEM TO YOUR DOCTOR. ? ?1. A  prescription for pain medication may be given to you upon discharge.  Take your pain medication as prescribed, if needed.  If narcotic pain medicine is not needed, then you may take acetaminophen (Tylenol) or ibuprofen (Advil) as needed. ?2. Take your usually prescribed medications unless otherwise directed. ?If you need a refill on your pain medication, please contact your pharmacy.  They will contact our office to request authorization. Prescriptions will not be filled after 5 pm or on week-ends. ?3. You should follow a light diet the first 24 hours after arrival home, such as soup and crackers, etc.  Be sure to include lots of fluids daily.  Resume your normal diet the day after surgery. ?4.Most patients will experience some swelling and bruising around the umbilicus or in the groin and scrotum.  Ice packs and reclining will help.  Swelling and bruising can take several days to resolve.  ?6. It is common to experience some constipation if taking pain medication after surgery.  Increasing fluid intake and taking a stool softener (such as Colace) will usually help or prevent this problem from occurring.  A mild laxative (Milk of Magnesia or Miralax) should be taken according to package directions if there are no bowel movements after 48 hours. ?7. Unless discharge instructions indicate otherwise, you may remove your bandages 24-48 hours after surgery, and you may shower at that time.  You may have steri-strips (small skin tapes) in place directly over the incision.  These strips should be left on the skin for 7-10 days.  If your surgeon used skin glue on the incision, you may  shower in 24 hours.  The glue will flake off over the next 2-3 weeks.  Any sutures or staples will be removed at the office during your follow-up visit. ?8. ACTIVITIES:  You may resume regular (light) daily activities beginning the next day--such as daily self-care, walking, climbing stairs--gradually increasing activities as tolerated.  You may have sexual intercourse when it is comfortable.  Refrain from any heavy lifting or straining until approved by your doctor. ? ?a.You may drive when you are no longer taking prescription pain medication, you can comfortably wear a seatbelt, and you can safely maneuver your car and apply brakes. ?b.RETURN TO WORK:   ?_____________________________________________ ? ?9.You should see your doctor in the office for a follow-up appointment approximately 2-3 weeks after your surgery.  Make sure that you call for this appointment within a day or two after you arrive home to insure a convenient appointment time. ?10.OTHER INSTRUCTIONS: _________________________ ?   _____________________________________ ? ?WHEN TO CALL YOUR DOCTOR: ?Fever over 101.0 ?Inability to urinate ?Nausea and/or vomiting ?Extreme swelling or bruising ?Continued bleeding from incision. ?Increased pain, redness, or drainage from the incision ? ?The clinic staff is available to answer your questions during regular business hours.  Please don?t hesitate to call and ask to speak to one of the nurses for clinical concerns.  If you have a medical emergency, go to the nearest emergency room or call 911.  A surgeon from [facility] is always on call at the hospital ? ? ?[address], [address], [place], [place]    [address] ? ? [address] [address], [place], [place]   [address] ?[phone] ? [phone] ? FAX [phone] ?Web site: [email or web address] ? ?

## 2022-01-22 DIAGNOSIS — Z8719 Personal history of other diseases of the digestive system: Secondary | ICD-10-CM | POA: Diagnosis not present

## 2022-01-22 DIAGNOSIS — K432 Incisional hernia without obstruction or gangrene: Secondary | ICD-10-CM | POA: Diagnosis not present

## 2022-01-22 DIAGNOSIS — Z9889 Other specified postprocedural states: Secondary | ICD-10-CM | POA: Diagnosis not present

## 2022-02-14 DIAGNOSIS — D120 Benign neoplasm of cecum: Secondary | ICD-10-CM | POA: Diagnosis not present

## 2022-02-14 DIAGNOSIS — D123 Benign neoplasm of transverse colon: Secondary | ICD-10-CM | POA: Diagnosis not present

## 2022-02-14 DIAGNOSIS — Z1211 Encounter for screening for malignant neoplasm of colon: Secondary | ICD-10-CM | POA: Diagnosis not present

## 2022-02-14 DIAGNOSIS — D128 Benign neoplasm of rectum: Secondary | ICD-10-CM | POA: Diagnosis not present

## 2022-02-14 DIAGNOSIS — K648 Other hemorrhoids: Secondary | ICD-10-CM | POA: Diagnosis not present

## 2022-02-19 DIAGNOSIS — Z8719 Personal history of other diseases of the digestive system: Secondary | ICD-10-CM | POA: Diagnosis not present

## 2022-02-19 DIAGNOSIS — Z9889 Other specified postprocedural states: Secondary | ICD-10-CM | POA: Diagnosis not present

## 2022-03-19 DIAGNOSIS — Z01419 Encounter for gynecological examination (general) (routine) without abnormal findings: Secondary | ICD-10-CM | POA: Diagnosis not present

## 2022-03-19 DIAGNOSIS — N898 Other specified noninflammatory disorders of vagina: Secondary | ICD-10-CM | POA: Diagnosis not present

## 2022-04-04 DIAGNOSIS — Z13220 Encounter for screening for lipoid disorders: Secondary | ICD-10-CM | POA: Diagnosis not present

## 2022-04-04 DIAGNOSIS — F3340 Major depressive disorder, recurrent, in remission, unspecified: Secondary | ICD-10-CM | POA: Diagnosis not present

## 2022-04-04 DIAGNOSIS — D509 Iron deficiency anemia, unspecified: Secondary | ICD-10-CM | POA: Diagnosis not present

## 2022-04-04 DIAGNOSIS — Z23 Encounter for immunization: Secondary | ICD-10-CM | POA: Diagnosis not present

## 2022-04-04 DIAGNOSIS — E559 Vitamin D deficiency, unspecified: Secondary | ICD-10-CM | POA: Diagnosis not present

## 2022-04-04 DIAGNOSIS — Z0000 Encounter for general adult medical examination without abnormal findings: Secondary | ICD-10-CM | POA: Diagnosis not present

## 2023-02-05 ENCOUNTER — Other Ambulatory Visit: Payer: Self-pay | Admitting: Obstetrics and Gynecology

## 2023-02-05 DIAGNOSIS — Z1231 Encounter for screening mammogram for malignant neoplasm of breast: Secondary | ICD-10-CM

## 2023-03-04 ENCOUNTER — Ambulatory Visit
Admission: RE | Admit: 2023-03-04 | Discharge: 2023-03-04 | Disposition: A | Discharge status: Home / Self Care | Payer: Managed Care, Other (non HMO) | Source: Ambulatory Visit | Attending: Obstetrics and Gynecology | Admitting: Obstetrics and Gynecology

## 2023-03-04 DIAGNOSIS — Z1231 Encounter for screening mammogram for malignant neoplasm of breast: Secondary | ICD-10-CM
